# Patient Record
Sex: Male | Born: 1960
Health system: Southern US, Community
[De-identification: ages and names within clinical notes are randomized; demographics above are authoritative.]

## PROBLEM LIST (undated history)

## (undated) DIAGNOSIS — I480 Paroxysmal atrial fibrillation: Secondary | ICD-10-CM

## (undated) DIAGNOSIS — E7800 Pure hypercholesterolemia, unspecified: Secondary | ICD-10-CM

## (undated) DIAGNOSIS — I452 Bifascicular block: Secondary | ICD-10-CM

## (undated) HISTORY — PX: EYE SURGERY: SHX253

---
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
## 2008-12-17 LAB — HM COLONOSCOPY: HM Colonoscopy: NEGATIVE

## 2011-02-12 ENCOUNTER — Emergency Department (HOSPITAL_COMMUNITY)
Admission: EM | Admit: 2011-02-12 | Discharge: 2011-02-13 | Disposition: A | Discharge status: Home / Self Care | Payer: 59 | Attending: Emergency Medicine | Admitting: Emergency Medicine

## 2011-02-12 ENCOUNTER — Emergency Department (HOSPITAL_COMMUNITY): Payer: 59

## 2011-02-12 DIAGNOSIS — I4891 Unspecified atrial fibrillation: Secondary | ICD-10-CM | POA: Insufficient documentation

## 2011-02-12 DIAGNOSIS — R0789 Other chest pain: Secondary | ICD-10-CM | POA: Insufficient documentation

## 2011-02-12 DIAGNOSIS — I498 Other specified cardiac arrhythmias: Secondary | ICD-10-CM | POA: Insufficient documentation

## 2011-02-12 DIAGNOSIS — R002 Palpitations: Secondary | ICD-10-CM | POA: Insufficient documentation

## 2011-02-12 DIAGNOSIS — E785 Hyperlipidemia, unspecified: Secondary | ICD-10-CM | POA: Insufficient documentation

## 2011-02-12 DIAGNOSIS — I440 Atrioventricular block, first degree: Secondary | ICD-10-CM | POA: Insufficient documentation

## 2011-02-12 LAB — CBC
HCT: 42.5 % (ref 39.0–52.0)
Hemoglobin: 14.9 g/dL (ref 13.0–17.0)
MCH: 32.3 pg (ref 26.0–34.0)
MCV: 92.2 fL (ref 78.0–100.0)
Platelets: 202 10*3/uL (ref 150–400)
RBC: 4.61 MIL/uL (ref 4.22–5.81)
WBC: 4.7 10*3/uL (ref 4.0–10.5)

## 2011-02-12 LAB — POCT I-STAT, CHEM 8
BUN: 18 mg/dL (ref 6–23)
Calcium, Ion: 1.1 mmol/L — ABNORMAL LOW (ref 1.12–1.32)
Creatinine, Ser: 1.1 mg/dL (ref 0.4–1.5)
Hemoglobin: 15 g/dL (ref 13.0–17.0)
Sodium: 142 mEq/L (ref 135–145)
TCO2: 25 mmol/L (ref 0–100)

## 2011-02-12 LAB — POCT CARDIAC MARKERS: Myoglobin, poc: 57.4 ng/mL (ref 12–200)

## 2011-02-13 LAB — POCT CARDIAC MARKERS
CKMB, poc: <1 ng/mL — ABNORMAL LOW (ref 1.0–8.0)
Troponin i, poc: <0.05 ng/mL (ref 0.00–0.09)

## 2011-02-13 LAB — TSH: TSH: 3.282 u[IU]/mL (ref 0.350–4.500)

## 2011-06-19 ENCOUNTER — Inpatient Hospital Stay (INDEPENDENT_AMBULATORY_CARE_PROVIDER_SITE_OTHER)
Admission: RE | Admit: 2011-06-19 | Discharge: 2011-06-19 | Disposition: A | Discharge status: Home / Self Care | Payer: 59 | Source: Ambulatory Visit | Attending: Family Medicine | Admitting: Family Medicine

## 2011-06-19 ENCOUNTER — Other Ambulatory Visit: Payer: Self-pay | Admitting: Family Medicine

## 2011-06-19 ENCOUNTER — Ambulatory Visit
Admission: RE | Admit: 2011-06-19 | Discharge: 2011-06-19 | Disposition: A | Discharge status: Home / Self Care | Payer: 59 | Source: Ambulatory Visit | Attending: Family Medicine | Admitting: Family Medicine

## 2011-06-19 ENCOUNTER — Encounter: Payer: Self-pay | Admitting: Family Medicine

## 2011-06-19 DIAGNOSIS — S8000XA Contusion of unspecified knee, initial encounter: Secondary | ICD-10-CM

## 2011-06-19 DIAGNOSIS — S8010XA Contusion of unspecified lower leg, initial encounter: Secondary | ICD-10-CM

## 2011-06-19 DIAGNOSIS — E785 Hyperlipidemia, unspecified: Secondary | ICD-10-CM | POA: Insufficient documentation

## 2011-06-19 DIAGNOSIS — E7800 Pure hypercholesterolemia, unspecified: Secondary | ICD-10-CM | POA: Insufficient documentation

## 2011-09-29 NOTE — Progress Notes (Signed)
Summary: RT KNEE PAIN AND SWELLING...WSE rm 2   Vital Signs:  Patient Profile:   50 Years Old Male CC:      RT knee pain and swelling x 5 days Height:     71 inches Weight:      197.25 pounds O2 Sat:      98 % O2 treatment:    Room Air Temp:     98.8 degrees F oral Pulse rate:   52 / minute Resp:     16 per minute BP sitting:   136 / 88  (left arm) Cuff size:   regular  Pt. in pain?   yes    Location:   knee    Intensity:   5    Type:       dull  Vitals Entered By: Clemens Catholic LPN (June 19, 2011 3:50 PM)                   Prior Medication List:  No prior medications documented  Updated Prior Medication List: CRESTOR 10 MG TABS (ROSUVASTATIN CALCIUM)  FLUTICASONE PROPIONATE 50 MCG/ACT SUSP (FLUTICASONE PROPIONATE)  DILTIAZEM HCL 120 MG TABS (DILTIAZEM HCL)  OMEGA-3 CF 1000 MG CAPS (OMEGA-3 FATTY ACIDS)  DAILY MULTI  TABS (MULTIPLE VITAMINS-MINERALS)  ASPIRIN 81 MG CHEW (ASPIRIN)   Current Allergies: No known allergies History of Present Illness Chief Complaint: RT knee pain and swelling x 5 days History of Present Illness: Patient hit his medial aspect of his R knee. There was some swellling over the area which he was not too concerned about and worked out and rode his bike. He noticed last night and this AM swelling along the medial aspect of his R lower leg.  Current Problems: CONTUSION OF LOWER LEG (ICD-924.10) CONTUSION OF KNEE (ICD-924.11) HYPERLIPIDEMIA (ICD-272.4)   Current Meds CRESTOR 10 MG TABS (ROSUVASTATIN CALCIUM)  FLUTICASONE PROPIONATE 50 MCG/ACT SUSP (FLUTICASONE PROPIONATE)  DILTIAZEM HCL 120 MG TABS (DILTIAZEM HCL)  OMEGA-3 CF 1000 MG CAPS (OMEGA-3 FATTY ACIDS)  DAILY MULTI  TABS (MULTIPLE VITAMINS-MINERALS)  ASPIRIN 81 MG CHEW (ASPIRIN)   REVIEW OF SYSTEMS Constitutional Symptoms      Denies fever, chills, night sweats, weight loss, weight gain, and fatigue.  Eyes       Denies change in vision, eye pain, eye discharge, glasses,  contact lenses, and eye surgery. Ear/Nose/Throat/Mouth       Denies hearing loss/aids, change in hearing, ear pain, ear discharge, dizziness, frequent runny nose, frequent nose bleeds, sinus problems, sore throat, hoarseness, and tooth pain or bleeding.  Respiratory       Denies dry cough, productive cough, wheezing, shortness of breath, asthma, bronchitis, and emphysema/COPD.  Cardiovascular       Denies murmurs, chest pain, and tires easily with exhertion.    Gastrointestinal       Denies stomach pain, nausea/vomiting, diarrhea, constipation, blood in bowel movements, and indigestion. Genitourniary       Denies painful urination, kidney stones, and loss of urinary control. Neurological       Denies paralysis, seizures, and fainting/blackouts. Musculoskeletal       Complains of redness and swelling.      Denies muscle pain, joint pain, joint stiffness, decreased range of motion, muscle weakness, and gout.  Skin       Complains of bruising.      Denies unusual mles/lumps or sores and hair/skin or nail changes.  Psych       Denies mood changes, temper/anger issues, anxiety/stress, speech  problems, depression, and sleep problems. Other Comments: pt states that he hit his RT knee on a kitchen cabinet 5 days ago, some pain and swelling in the area that day. Yesterday he noticed more swelling to his knee and down his leg. he has taken IBF for the pain.   Past History:  Family History: Last updated: 06/19/2011 none  Social History: Last updated: 06/19/2011 Never Smoked Alcohol use-yes 3-4 drinks per wk Drug use-no  Risk Factors: Smoking Status: never (06/19/2011)  Past Medical History: Hyperlipidemia-borderline short run afib - controlled  Past Surgical History: Denies surgical history  Family History: Reviewed history and no changes required. none  Social History: Reviewed history and no changes required. Never Smoked Alcohol use-yes 3-4 drinks per wk Drug  use-no Smoking Status:  never Drug Use:  no Physical Exam General appearance: well developed, well nourished, no acute distress Extremities: Hematoma over the R medial area of his knee swelling along the whole lower R leg w/discoleration along the leg to his ankle Skin: no obvious rashes or lesions MSE: oriented to time, place, and person Assessment New Problems: CONTUSION OF LOWER LEG (ICD-924.10) CONTUSION OF KNEE (ICD-924.11) HYPERLIPIDEMIA (ICD-272.4)   Patient Education: would hold off any PE until 06/28/2011  Plan New Orders: T-DG Knee 2 Views*R* [73560] T-DG Tibia/Fibula*R* [16109] New Patient Level III [60454] Follow Up: Follow up on an as needed basis, Follow up with Primary Physician  The patient and/or caregiver has been counseled thoroughly with regard to medications prescribed including dosage, schedule, interactions, rationale for use, and possible side effects and they verbalize understanding.  Diagnoses and expected course of recovery discussed and will return if not improved as expected or if the condition worsens. Patient and/or caregiver verbalized understanding.   Patient Instructions: 1)  Please schedule a follow-up appointment as needed. 2)  Please schedule an appointment with your primary doctor in :5-7 days if not better 3)  ice the area 20 minutes out of 2 hours as tolerated 4)  no PE until after 06/28/2011 5)  use the knee brace  Orders Added: 1)  T-DG Knee 2 Views*R* [73560] 2)  T-DG Tibia/Fibula*R* [73590] 3)  New Patient Level III [09811]

## 2012-05-18 ENCOUNTER — Ambulatory Visit (HOSPITAL_COMMUNITY)
Admission: RE | Admit: 2012-05-18 | Discharge: 2012-05-18 | Disposition: A | Discharge status: Home / Self Care | Payer: 59 | Source: Ambulatory Visit | Attending: Cardiology | Admitting: Cardiology

## 2012-05-18 DIAGNOSIS — I4891 Unspecified atrial fibrillation: Secondary | ICD-10-CM | POA: Insufficient documentation

## 2012-05-18 DIAGNOSIS — I379 Nonrheumatic pulmonary valve disorder, unspecified: Secondary | ICD-10-CM | POA: Insufficient documentation

## 2012-05-18 DIAGNOSIS — I079 Rheumatic tricuspid valve disease, unspecified: Secondary | ICD-10-CM | POA: Insufficient documentation

## 2012-05-18 DIAGNOSIS — I359 Nonrheumatic aortic valve disorder, unspecified: Secondary | ICD-10-CM | POA: Insufficient documentation

## 2012-06-08 ENCOUNTER — Encounter: Payer: Self-pay | Admitting: *Deleted

## 2012-06-08 ENCOUNTER — Emergency Department
Admission: EM | Admit: 2012-06-08 | Discharge: 2012-06-08 | Disposition: A | Discharge status: Home / Self Care | Payer: 59 | Source: Home / Self Care | Attending: Family Medicine | Admitting: Family Medicine

## 2012-06-08 DIAGNOSIS — L309 Dermatitis, unspecified: Secondary | ICD-10-CM

## 2012-06-08 DIAGNOSIS — L259 Unspecified contact dermatitis, unspecified cause: Secondary | ICD-10-CM

## 2012-06-08 MED ORDER — CEPHALEXIN 500 MG PO CAPS: 500.0000 mg | ORAL_CAPSULE | Freq: Three times a day (TID) | ORAL | Status: AC

## 2012-06-08 MED ORDER — PREDNISONE 20 MG PO TABS: 20.0000 mg | ORAL_TABLET | Freq: Two times a day (BID) | ORAL | Status: AC

## 2012-06-08 NOTE — ED Notes (Signed)
Pt c/o rash on his RT upper leg x last night post insect bites. Denies fever. He has taken an antihistamine last night and applied hydrocortisone cream today with no relief.

## 2012-06-08 NOTE — ED Provider Notes (Signed)
History     CSN: [id number]  Arrival date & time 06/08/12  1851   First MD Initiated Contact with Patient 06/08/12 1944      Chief Complaint  Patient presents with  . Rash     HPI Comments: Pt c/o rash on his RT upper leg after working in his yard.  He believes that he may have insect bites but does not recall any actual bites.  Denies fever. He has taken an antihistamine last night and applied hydrocortisone cream today with no relief.  Today he has noticed new lesions on lateral aspect of right thigh.  He feels well otherwise.  Patient is a 51 y.o. male presenting with rash. The history is provided by the patient.  Rash  This is a new problem. The current episode started yesterday. The problem has been rapidly worsening. The problem is associated with an insect bite/sting. There has been no fever. The rash is present on the right upper leg. The patient is experiencing no pain. Associated symptoms include itching. Pertinent negatives include no blisters, no pain and no weeping. He has tried steriods for the symptoms. The treatment provided no relief.    Past Medical History  Diagnosis Date  . Afib     Past Surgical History  Procedure Date  . Hernia repair     Family History  Problem Relation Age of Onset  . COPD Mother   . COPD Father     History  Substance Use Topics  . Smoking status: Never Smoker   . Smokeless tobacco: Not on file  . Alcohol Use: Yes      Review of Systems  Skin: Positive for itching and rash.  All other systems reviewed and are negative.    Allergies  Review of patient's allergies indicates no known allergies.  Home Medications   Current Outpatient Rx  Name Route Sig Dispense Refill  . ASPIRIN 81 MG PO TABS Oral Take 81 mg by mouth daily.    Marland Kitchen DILTIAZEM HCL 120 MG PO TABS Oral Take 120 mg by mouth 4 (four) times daily.    . OMEGA-3 FATTY ACIDS 1000 MG PO CAPS Oral Take 2 g by mouth daily.    Marland Kitchen FLUTICASONE PROPIONATE 50 MCG/ACT NA  SUSP Nasal Place 2 sprays into the nose daily.    . MULTIPLE VITAMIN PO Oral Take by mouth.    . ROSUVASTATIN CALCIUM 10 MG PO TABS Oral Take 10 mg by mouth daily.    . CEPHALEXIN 500 MG PO CAPS Oral Take 1 capsule (500 mg total) by mouth 3 (three) times daily. 21 capsule 0  . PREDNISONE 20 MG PO TABS Oral Take 1 tablet (20 mg total) by mouth 2 (two) times daily. Take with food. 10 tablet 0    BP 135/87  Pulse 60  Temp 98.2 F (36.8 C) (Oral)  Resp 16  Ht 5\' 11"  (1.803 m)  Wt 195 lb (88.451 kg)  BMI 27.20 kg/m2  SpO2 99%  Physical Exam  Nursing note and vitals reviewed. Constitutional: He is oriented to person, place, and time. He appears well-developed and well-nourished. No distress.  HENT:  Mouth/Throat: Oropharynx is clear and moist.  Eyes: Conjunctivae are normal. Pupils are equal, round, and reactive to light.  Cardiovascular: Normal heart sounds.   Pulmonary/Chest: Breath sounds normal.  Musculoskeletal: He exhibits no edema.  Neurological: He is alert and oriented to person, place, and time.  Skin: Skin is warm and dry. Rash noted. Rash is macular.  On the right anterior thigh as noted on diagram there is a large area of confluent erythema.  On the areas marked in green there are crops of discrete small maculo-papular lesions suggestive of zoster.  No tenderness or fluctuance.     ED Course  Procedures none      1. Dermatitis;  Suspect insect bites with secondary cellulitis.  However, could also be herpes zoster, although association with outdoor exposure more likely to be a result of insect bite toxin      MDM  Begin empiric Keflex.  Prednisone burst.  Continue Claritin Followup with Family Doctor if not improved in about 3 days.        Lattie Haw, MD 06/10/12 [phone]

## 2012-06-12 ENCOUNTER — Telehealth: Payer: Self-pay

## 2012-06-12 NOTE — ED Notes (Signed)
I called and spoke with patient and he is doing better. I advised to call back if anything changes or if he has questions or concerns.  

## 2012-12-17 ENCOUNTER — Encounter: Payer: Self-pay | Admitting: Family Medicine

## 2012-12-17 ENCOUNTER — Ambulatory Visit (INDEPENDENT_AMBULATORY_CARE_PROVIDER_SITE_OTHER): Payer: 59 | Admitting: Family Medicine

## 2012-12-17 VITALS — BP 120/80 | HR 72 | Temp 97.4°F | Resp 12 | Ht 71.0 in | Wt 194.0 lb

## 2012-12-17 DIAGNOSIS — Z0000 Encounter for general adult medical examination without abnormal findings: Secondary | ICD-10-CM

## 2012-12-17 DIAGNOSIS — I4891 Unspecified atrial fibrillation: Secondary | ICD-10-CM

## 2012-12-17 DIAGNOSIS — E785 Hyperlipidemia, unspecified: Secondary | ICD-10-CM

## 2012-12-17 DIAGNOSIS — Z23 Encounter for immunization: Secondary | ICD-10-CM

## 2012-12-17 LAB — BASIC METABOLIC PANEL
Calcium: 9.5 mg/dL (ref 8.4–10.5)
GFR: 96.12 mL/min (ref >60.00)
Glucose, Bld: 91 mg/dL (ref 70–99)
Potassium: 4 mEq/L (ref 3.5–5.1)
Sodium: 139 mEq/L (ref 135–145)

## 2012-12-17 LAB — CBC WITH DIFFERENTIAL/PLATELET
Basophils Relative: 0.6 % (ref 0.0–3.0)
Eosinophils Absolute: 0.1 10*3/uL (ref 0.0–0.7)
Eosinophils Relative: 1.1 % (ref 0.0–5.0)
HCT: 45.7 % (ref 39.0–52.0)
Lymphs Abs: 1.6 10*3/uL (ref 0.7–4.0)
MCHC: 33.8 g/dL (ref 30.0–36.0)
MCV: 93.5 fl (ref 78.0–100.0)
Monocytes Absolute: 0.5 10*3/uL (ref 0.1–1.0)
Platelets: 309 10*3/uL (ref 150.0–400.0)
WBC: 6.3 10*3/uL (ref 4.5–10.5)

## 2012-12-17 LAB — LIPID PANEL
Cholesterol: 194 mg/dL (ref 0–200)
HDL: 50.8 mg/dL (ref >39.00)
VLDL: 26.6 mg/dL (ref 0.0–40.0)

## 2012-12-17 LAB — HEPATIC FUNCTION PANEL
ALT: 80 U/L — ABNORMAL HIGH (ref 0–53)
AST: 41 U/L — ABNORMAL HIGH (ref 0–37)
Albumin: 4.6 g/dL (ref 3.5–5.2)

## 2012-12-17 LAB — PSA: PSA: 1.2 ng/mL (ref 0.10–4.00)

## 2012-12-17 LAB — TSH: TSH: 1.59 u[IU]/mL (ref 0.35–5.50)

## 2012-12-17 MED ORDER — FLUTICASONE PROPIONATE 50 MCG/ACT NA SUSP: 2.0000 | Freq: Every day | NASAL | Status: DC

## 2012-12-17 NOTE — Progress Notes (Signed)
  Subjective:    Patient ID: Tony Silva, male    DOB: Mar 30, 1961, 52 y.o.   MRN: [id number]  HPI Patient new to establish care. Past medical history reviewed. He has history of lone atrial fibrillation. Followed by cardiology. Generally has about 3-4 episodes per year. He takes diltiazem. Also Crestor for hyperlipidemia. Previous intolerance to Lipitor. Flonase for perennial allergies.  Last tetanus unknown. Exercises regularly. No history of smoking. Only occasional alcohol use.  Family history significant for both parents with COPD and they were both smokers. Otherwise unrevealing.   Review of Systems  Constitutional: Negative for fever, activity change, appetite change and fatigue.  HENT: Negative for ear pain, congestion and trouble swallowing.   Eyes: Negative for pain and visual disturbance.  Respiratory: Negative for cough, shortness of breath and wheezing.   Cardiovascular: Negative for chest pain and palpitations.  Gastrointestinal: Negative for nausea, vomiting, abdominal pain, diarrhea, constipation, blood in stool, abdominal distention and rectal pain.  Genitourinary: Negative for dysuria, hematuria and testicular pain.  Musculoskeletal: Negative for joint swelling and arthralgias.  Skin: Negative for rash.  Neurological: Negative for dizziness, syncope and headaches.  Hematological: Negative for adenopathy.  Psychiatric/Behavioral: Negative for confusion and dysphoric mood.       Objective:   Physical Exam  Constitutional: He is oriented to person, place, and time. He appears well-developed and well-nourished. No distress.  HENT:  Head: Normocephalic and atraumatic.  Right Ear: External ear normal.  Left Ear: External ear normal.  Mouth/Throat: Oropharynx is clear and moist.  Eyes: Conjunctivae and EOM are normal. Pupils are equal, round, and reactive to light.  Neck: Normal range of motion. Neck supple. No thyromegaly present.  Cardiovascular: Normal rate,  regular rhythm and normal heart sounds.   No murmur heard. Pulmonary/Chest: No respiratory distress. He has no wheezes. He has no rales.  Abdominal: Soft. Bowel sounds are normal. He exhibits no distension and no mass. There is no tenderness. There is no rebound and no guarding.  Genitourinary: Rectum normal and prostate normal.  Musculoskeletal: He exhibits no edema.  Lymphadenopathy:    He has no cervical adenopathy.  Neurological: He is alert and oriented to person, place, and time. He displays normal reflexes. No cranial nerve deficit.  Skin: No rash noted.  Psychiatric: He has a normal mood and affect.          Assessment & Plan:  Complete physical. Tetanus booster given. Obtain screening lab work. Continue regular exercise habits. Refill Flonase for one year. He will need repeat colonoscopy in 2 years (history of reported hyperplastic polyps by colonoscopy 3 years ago).

## 2012-12-21 ENCOUNTER — Other Ambulatory Visit: Payer: Self-pay | Admitting: *Deleted

## 2012-12-21 DIAGNOSIS — R7989 Other specified abnormal findings of blood chemistry: Secondary | ICD-10-CM

## 2012-12-21 NOTE — Progress Notes (Signed)
Quick Note:  Sent lab result note to My Chart ______

## 2013-01-02 ENCOUNTER — Encounter: Payer: Self-pay | Admitting: Family Medicine

## 2013-03-07 ENCOUNTER — Encounter: Payer: Self-pay | Admitting: Family Medicine

## 2013-03-08 NOTE — Telephone Encounter (Signed)
Pt requesting Scopase 0.4 mg for motion sickness prevention.  I'm not sure how locally available this is. OK to prescribe and dosing is one every 8 hours prn disp #20.  Please let pt know we are sending this in.

## 2013-03-09 NOTE — Telephone Encounter (Signed)
I could not find Scopase in Epic, so I called [facility].  Pharmacist checked into it and could not find it to order, so she googled it and med was discontinued several years ago.  I told our patient and he is going to research it a little more.  He will my chart [place] back if he needs the patch.  FYI

## 2013-03-10 NOTE — Telephone Encounter (Signed)
noted 

## 2013-03-14 NOTE — Telephone Encounter (Signed)
Send in scopolamine patches as requested.  I accidentally closed this note prematurely so adding addendum

## 2013-03-14 NOTE — Telephone Encounter (Signed)
Pt requesting the patch for traveling, scopace discontinued

## 2013-03-14 NOTE — Telephone Encounter (Signed)
OK to send in Scopolamine patches one behind ear q 72 hours prn motion sickness prevention.  Disp 9 patches.

## 2013-03-15 MED ORDER — SCOPOLAMINE 1 MG/3DAYS TD PT72: 1.0000 | MEDICATED_PATCH | TRANSDERMAL | Status: DC

## 2013-03-15 NOTE — Addendum Note (Signed)
Addended by: Melchor Amour on: 03/15/2013 08:09 AM   Modules accepted: Orders

## 2013-03-16 ENCOUNTER — Other Ambulatory Visit (INDEPENDENT_AMBULATORY_CARE_PROVIDER_SITE_OTHER): Payer: 59

## 2013-03-16 ENCOUNTER — Encounter: Payer: Self-pay | Admitting: Family Medicine

## 2013-03-16 DIAGNOSIS — R7989 Other specified abnormal findings of blood chemistry: Secondary | ICD-10-CM

## 2013-03-16 LAB — HEPATIC FUNCTION PANEL
Albumin: 4.3 g/dL (ref 3.5–5.2)
Total Bilirubin: 0.8 mg/dL (ref 0.3–1.2)

## 2013-05-03 ENCOUNTER — Ambulatory Visit (HOSPITAL_COMMUNITY)
Admission: RE | Admit: 2013-05-03 | Discharge: 2013-05-03 | Disposition: A | Discharge status: Home / Self Care | Payer: 59 | Source: Ambulatory Visit | Attending: Cardiology | Admitting: Cardiology

## 2013-05-03 DIAGNOSIS — I4891 Unspecified atrial fibrillation: Secondary | ICD-10-CM | POA: Insufficient documentation

## 2013-05-03 NOTE — Progress Notes (Signed)
*  PRELIMINARY RESULTS* Echocardiogram 2D Echocardiogram has been performed.  Tony Silva 05/03/2013, 11:54 AM

## 2013-06-16 ENCOUNTER — Other Ambulatory Visit: Payer: Self-pay | Admitting: Cardiology

## 2013-06-16 DIAGNOSIS — R079 Chest pain, unspecified: Secondary | ICD-10-CM

## 2013-06-17 ENCOUNTER — Other Ambulatory Visit: Payer: Self-pay | Admitting: Cardiovascular Disease

## 2013-06-17 DIAGNOSIS — R079 Chest pain, unspecified: Secondary | ICD-10-CM

## 2013-06-21 ENCOUNTER — Ambulatory Visit (INDEPENDENT_AMBULATORY_CARE_PROVIDER_SITE_OTHER)
Admission: RE | Admit: 2013-06-21 | Discharge: 2013-06-21 | Disposition: A | Discharge status: Home / Self Care | Payer: 59 | Source: Ambulatory Visit | Attending: Cardiovascular Disease | Admitting: Cardiovascular Disease

## 2013-06-21 ENCOUNTER — Ambulatory Visit (HOSPITAL_COMMUNITY): Payer: 59

## 2013-06-21 DIAGNOSIS — R079 Chest pain, unspecified: Secondary | ICD-10-CM

## 2013-06-22 ENCOUNTER — Encounter: Payer: Self-pay | Admitting: Family Medicine

## 2013-09-01 ENCOUNTER — Other Ambulatory Visit: Payer: Self-pay

## 2013-10-10 ENCOUNTER — Ambulatory Visit: Payer: 59 | Admitting: Family Medicine

## 2013-10-12 ENCOUNTER — Encounter: Payer: Self-pay | Admitting: Family Medicine

## 2013-10-12 ENCOUNTER — Ambulatory Visit (INDEPENDENT_AMBULATORY_CARE_PROVIDER_SITE_OTHER): Payer: 59 | Admitting: Family Medicine

## 2013-10-12 VITALS — BP 130/90 | HR 93 | Temp 98.6°F | Wt 201.0 lb

## 2013-10-12 DIAGNOSIS — R1031 Right lower quadrant pain: Secondary | ICD-10-CM

## 2013-10-12 NOTE — Progress Notes (Signed)
Pre visit review using our clinic review tool, if applicable. No additional management support is needed unless otherwise documented below in the visit note. 

## 2013-10-12 NOTE — Patient Instructions (Signed)
Be in touch if you note any increased pain.

## 2013-10-12 NOTE — Progress Notes (Signed)
   Subjective:    Patient ID: Tony Silva, male    DOB: [birth year], 52 y.o.   MRN: [id number]  HPI Right inguinal pain. Noted a few months ago. Worse with standing. Mild swelling. Patient relates history of left inguinal hernia at age 84 or 1. 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 This does come down fully into the sac with things like showers Pain is relatively mild. Only noted with standing.  Past Medical History  Diagnosis Date  . Afib   . Hypertension    Past Surgical History  Procedure Laterality Date  . Hernia repair  1979    reports that he has never smoked. He does not have any smokeless tobacco history on file. He reports that he drinks alcohol. He reports that he does not use illicit drugs. family history includes COPD in his father and mother. No Known Allergies     Review of Systems  Constitutional: Negative for appetite change and unexpected weight change.  Genitourinary: Negative for dysuria.  Hematological: Negative for adenopathy.       Objective:   Physical Exam  Constitutional: He appears well-developed and well-nourished.  Cardiovascular: Normal rate.   Genitourinary:  Retracted right testicle. Left testicle is normal. We could not palpate any definite hernia mass this time          Assessment & Plan:  Right inguinal pain. Retracted right testicle. No definite hernia. Could be early right inguinal hernia but difficult to ascertain. Followup with general surgeon if symptoms persist or worsen

## 2013-11-17 ENCOUNTER — Other Ambulatory Visit: Payer: Self-pay | Admitting: Cardiology

## 2013-11-20 ENCOUNTER — Telehealth: Payer: Self-pay | Admitting: Family Medicine

## 2013-11-20 NOTE — Telephone Encounter (Signed)
[facility] requesting refill of rosuvastatin (CRESTOR) 10 MG tablet

## 2013-11-21 MED ORDER — ROSUVASTATIN CALCIUM 10 MG PO TABS: 10.0000 mg | ORAL_TABLET | ORAL | Status: DC

## 2013-11-21 NOTE — Telephone Encounter (Signed)
RX sent to pharmacy  

## 2014-02-01 ENCOUNTER — Other Ambulatory Visit: Payer: Self-pay | Admitting: Family Medicine

## 2014-04-17 ENCOUNTER — Encounter: Payer: Self-pay | Admitting: Family Medicine

## 2014-04-18 ENCOUNTER — Other Ambulatory Visit: Payer: Self-pay | Admitting: Family Medicine

## 2014-04-18 DIAGNOSIS — K4090 Unilateral inguinal hernia, without obstruction or gangrene, not specified as recurrent: Secondary | ICD-10-CM

## 2014-04-19 MED ORDER — ZOSTER VACCINE LIVE 19400 UNT/0.65ML ~~LOC~~ SOLR: 0.6500 mL | Freq: Once | SUBCUTANEOUS | Status: DC

## 2014-05-05 ENCOUNTER — Encounter (INDEPENDENT_AMBULATORY_CARE_PROVIDER_SITE_OTHER): Payer: Self-pay | Admitting: General Surgery

## 2014-05-05 ENCOUNTER — Ambulatory Visit (INDEPENDENT_AMBULATORY_CARE_PROVIDER_SITE_OTHER): Payer: Commercial Managed Care - PPO | Admitting: General Surgery

## 2014-05-05 VITALS — BP 126/80 | HR 85 | Temp 97.5°F | Resp 16 | Ht 72.0 in | Wt 196.0 lb

## 2014-05-05 DIAGNOSIS — K4090 Unilateral inguinal hernia, without obstruction or gangrene, not specified as recurrent: Secondary | ICD-10-CM

## 2014-05-05 NOTE — Progress Notes (Signed)
Patient ID: Tony Silva, male   DOB: 01/30/1961, 53 y.o.   MRN: [id number]  Chief Complaint  Patient presents with  . eval hernia    HPI Tony Silva is a 53 y.o. male.  The patient is a 53 year old male who is referred by Dr. Jarold Song for evaluation of a right inguinal hernia. The patient states she's had increasing pain over the last several years. He does have a history of a left femoral hernia that was fixed at the age of 22 and 60. The patient has a easily retractable practice will however them to scrotum stone.  Patient has a history of A. Fib for which he takes diltiazem H. Asher bouts of A. Fib every several months. He is not on any blood thinners.  HPI  Past Medical History  Diagnosis Date  . Afib   . Hypertension     Past Surgical History  Procedure Laterality Date  . Hernia repair  1979    Family History  Problem Relation Age of Onset  . COPD Mother   . COPD Father     Social History History  Substance Use Topics  . Smoking status: Never Smoker   . Smokeless tobacco: Not on file  . Alcohol Use: Yes    No Known Allergies  Current Outpatient Prescriptions  Medication Sig Dispense Refill  . diltiazem (CARDIZEM) 120 MG tablet Take 120 mg by mouth daily.       . fish oil-omega-3 fatty acids 1000 MG capsule Take 2 g by mouth daily.      . fluticasone (FLONASE) 50 MCG/ACT nasal spray PLACE 2 SPRAYS INTO THE NOSE DAILY.  48 g  3  . MULTIPLE VITAMIN PO Take by mouth.      . rosuvastatin (CRESTOR) 10 MG tablet Take 1 tablet (10 mg total) by mouth once a week.  30 tablet  1  . scopolamine (TRANSDERM-SCOP) 1.5 MG Place 1 patch (1.5 mg total) onto the skin every 3 (three) days.  10 patch  0  . zoster vaccine live, PF, (ZOSTAVAX) 78295 UNT/0.65ML injection Inject 19,400 Units into the skin once.  1 each  0   No current facility-administered medications for this visit.    Review of Systems Review of Systems  Constitutional: Negative.   HENT: Negative.   Eyes:  Negative.   Respiratory: Negative.   Cardiovascular: Negative.   Gastrointestinal: Negative.   Endocrine: Negative.   Neurological: Negative.     Blood pressure 126/80, pulse 85, temperature 97.5 F (36.4 C), resp. rate 16, height 6' (1.829 m), weight 196 lb (88.905 kg).  Physical Exam Physical Exam  Constitutional: He is oriented to person, place, and time. He appears well-developed and well-nourished.  HENT:  Head: Normocephalic and atraumatic.  Eyes: Conjunctivae and EOM are normal. Pupils are equal, round, and reactive to light.  Neck: Normal range of motion. Neck supple.  Cardiovascular: Normal rate, regular rhythm and normal heart sounds.   Pulmonary/Chest: Effort normal and breath sounds normal.  Abdominal: A hernia is present. Hernia confirmed positive in the right inguinal area. Hernia confirmed negative in the left inguinal area.    Healed LIHR incision  Musculoskeletal: Normal range of motion.  Neurological: He is alert and oriented to person, place, and time.  Skin: Skin is warm and dry.    Data Reviewed none  Assessment    53 year old male with a right inguinal hernia     Plan    1. We'll proceed to the operating room for an  open right inguinal hernia repair with mesh 2.All risks and benefits were discussed with the patient, to generally include infection, bleeding, damage to surrounding structures, acute and chronic nerve pain, and recurrence. Alternatives were offered and described.  All questions were answered and the patient voiced understanding of the procedure and wishes to proceed at this point.         Rosario Jacks., Veta Dambrosia 05/05/2014, 9:25 AM

## 2014-05-17 ENCOUNTER — Other Ambulatory Visit (INDEPENDENT_AMBULATORY_CARE_PROVIDER_SITE_OTHER): Payer: Self-pay | Admitting: General Surgery

## 2014-05-23 ENCOUNTER — Encounter (HOSPITAL_COMMUNITY): Payer: Self-pay | Admitting: Pharmacy Technician

## 2014-05-29 ENCOUNTER — Encounter (HOSPITAL_COMMUNITY): Payer: Self-pay

## 2014-05-29 ENCOUNTER — Encounter (HOSPITAL_COMMUNITY)
Admission: RE | Admit: 2014-05-29 | Discharge: 2014-05-29 | Disposition: A | Discharge status: Home / Self Care | Payer: 59 | Source: Ambulatory Visit | Attending: Anesthesiology | Admitting: Anesthesiology

## 2014-05-29 ENCOUNTER — Encounter (HOSPITAL_COMMUNITY)
Admission: RE | Admit: 2014-05-29 | Discharge: 2014-05-29 | Disposition: A | Discharge status: Home / Self Care | Payer: 59 | Source: Ambulatory Visit | Attending: General Surgery | Admitting: General Surgery

## 2014-05-29 DIAGNOSIS — Z01812 Encounter for preprocedural laboratory examination: Secondary | ICD-10-CM | POA: Insufficient documentation

## 2014-05-29 DIAGNOSIS — Z01818 Encounter for other preprocedural examination: Secondary | ICD-10-CM | POA: Insufficient documentation

## 2014-05-29 DIAGNOSIS — E7800 Pure hypercholesterolemia, unspecified: Secondary | ICD-10-CM | POA: Insufficient documentation

## 2014-05-29 DIAGNOSIS — Z01810 Encounter for preprocedural cardiovascular examination: Secondary | ICD-10-CM | POA: Insufficient documentation

## 2014-05-29 DIAGNOSIS — I4891 Unspecified atrial fibrillation: Secondary | ICD-10-CM | POA: Insufficient documentation

## 2014-05-29 DIAGNOSIS — I10 Essential (primary) hypertension: Secondary | ICD-10-CM | POA: Insufficient documentation

## 2014-05-29 HISTORY — DX: Pure hypercholesterolemia, unspecified: E78.00

## 2014-05-29 LAB — COMPREHENSIVE METABOLIC PANEL
ALT: 33 U/L (ref 0–53)
AST: 26 U/L (ref 0–37)
Albumin: 4.4 g/dL (ref 3.5–5.2)
Alkaline Phosphatase: 59 U/L (ref 39–117)
Anion gap: 14 (ref 5–15)
BILIRUBIN TOTAL: 0.4 mg/dL (ref 0.3–1.2)
BUN: 13 mg/dL (ref 6–23)
CO2: 27 meq/L (ref 19–32)
CREATININE: 0.83 mg/dL (ref 0.50–1.35)
Calcium: 9.1 mg/dL (ref 8.4–10.5)
Chloride: 100 mEq/L (ref 96–112)
GFR calc Af Amer: >90 mL/min (ref >90)
Glucose, Bld: 151 mg/dL — ABNORMAL HIGH (ref 70–99)
POTASSIUM: 3.6 meq/L — AB (ref 3.7–5.3)
Sodium: 141 mEq/L (ref 137–147)
Total Protein: 7.2 g/dL (ref 6.0–8.3)

## 2014-05-29 LAB — CBC
HEMATOCRIT: 42.7 % (ref 39.0–52.0)
Hemoglobin: 14.7 g/dL (ref 13.0–17.0)
MCH: 32.4 pg (ref 26.0–34.0)
MCHC: 34.4 g/dL (ref 30.0–36.0)
MCV: 94.1 fL (ref 78.0–100.0)
PLATELETS: 222 10*3/uL (ref 150–400)
RBC: 4.54 MIL/uL (ref 4.22–5.81)
RDW: 12.4 % (ref 11.5–15.5)
WBC: 4.9 10*3/uL (ref 4.0–10.5)

## 2014-05-29 NOTE — Progress Notes (Addendum)
Patient states he had a-fib, was started on cardizem and has had no problems since.  Heart tests came back neg.  He sees Dr. Sheilah Pigeon acouple of months ago.   Have called [facility] x 2, leaving a message for medical records to send me his last LOV and EKG.   [phone]

## 2014-05-29 NOTE — Pre-Procedure Instructions (Signed)
HERMON ZEA  05/29/2014   Your procedure is scheduled on:  August 10th, Monday   Report to [facility] Admitting at  9:00 AM.   Call this number if you have problems the morning of surgery: [phone]   Remember:   Do not eat food or drink liquids after midnight Sunday.   Take these medicines the morning of surgery with A SIP OF WATER: Cardizem. Please use Flonase.   Do not wear jewelry - no rings or watches.  Do not wear lotions or colognes.   You may not wear deodorant.   Men may shave face and neck.   Do not bring valuables to the hospital.  [facility] is not responsible for any belongings or valuables.               Contacts, dentures or bridgework may not be worn into surgery.  Leave suitcase in the car. After surgery it may be brought to your room.  For patients admitted to the hospital, discharge time is determined by your treatment team.               Patients discharged the day of surgery will not be allowed to drive home, and will need someone to stay with you for 24 hrs afterwards.   Name and phone number of your driver:    Special Instructions: "Preparing for Surgery" instruction sheet.   Please read over the following fact sheets that you were given: Pain Booklet and Surgical Site Infection Prevention

## 2014-05-30 ENCOUNTER — Encounter (HOSPITAL_COMMUNITY): Payer: Self-pay

## 2014-05-30 NOTE — Progress Notes (Signed)
Anesthesia Chart Review:  Patient is a 53 year old male scheduled for open right IHR on 06/05/14 by Dr. Rosendo Gros.  History includes paroxysmal atrial fibrillation 01/2011, HTN, hypercholesterolemia, non-smoker, left femoral hernia repair '79, lasik eye surgery. PCP is Dr. Carolann Littler.  Cardiologist is Dr. Marlou Porch, last office note from 05/24/12.    Meds: MVI, diltiazem, fish oil, Flonase, Crestor, scopolamine patch PRN.  EKG on 05/29/14 showed: SR with first degree AVB.  EKG appears stable since 02/12/11.  Echo on 05/03/13 showed:  - Left ventricle: The cavity size was normal. There was mild focal basal hypertrophy of the septum. Systolic function was normal. The estimated ejection fraction was in the range of 60% to 65%. Wall motion was normal; there were no regional wall motion abnormalities. - Left atrium: The atrium was mildly dilated. - Aortic root: Mildly dilated, 38 mm.  - Atrial septum: There was redundancy of the septum, with borderline criteria for aneurysm. - Tricuspid valve: Trivial regurgitation.  CT cardiac calcium scoring on 06/21/13 showed:  1. No significant incidental noncardiac findings.  2. Old healed posterior left sided rib fractures, as above.  Cardiac Calcium Score:  Findings:  Calcium Score 0  Normal Pericardium  Ascending Aorta normal 3.1 cm  Impression: Calcium Score 0   CXR on 05/29/14 showed: No active cardiopulmonary disease.  Preoperative labs noted.   Patient has a history of PAF in 01/2011.  PAT EKG shows SR.  He remains on diltiazem.  No acute CV symptoms documented from his PAT visit.  If no acute changes then I would anticipate that he could proceed as planned.  Reviewed with anesthesiologist Dr. Linna Caprice.  [facility] Stay [facility]/Anesthesiology Phone [phone] 05/30/2014 4:45 PM

## 2014-06-04 MED ORDER — CEFAZOLIN SODIUM-DEXTROSE 2-3 GM-% IV SOLR
2.0000 g | INTRAVENOUS | Status: AC
  Administered 2014-06-05: 2 g via INTRAVENOUS
  Filled 2014-06-04: qty 50

## 2014-06-04 MED ORDER — CHLORHEXIDINE GLUCONATE 4 % EX LIQD
1.0000 "application " | Freq: Once | CUTANEOUS | Status: DC
  Filled 2014-06-04: qty 15

## 2014-06-05 ENCOUNTER — Ambulatory Visit (HOSPITAL_COMMUNITY)
Admission: RE | Admit: 2014-06-05 | Discharge: 2014-06-05 | Disposition: A | Discharge status: Home / Self Care | Payer: 59 | Source: Ambulatory Visit | Attending: General Surgery | Admitting: General Surgery

## 2014-06-05 ENCOUNTER — Encounter (HOSPITAL_COMMUNITY): Payer: Self-pay | Admitting: *Deleted

## 2014-06-05 ENCOUNTER — Ambulatory Visit (HOSPITAL_COMMUNITY): Payer: 59 | Admitting: Anesthesiology

## 2014-06-05 ENCOUNTER — Encounter (HOSPITAL_COMMUNITY)
Admission: RE | Disposition: A | Discharge status: Home / Self Care | Payer: Self-pay | Source: Ambulatory Visit | Attending: General Surgery

## 2014-06-05 ENCOUNTER — Encounter (HOSPITAL_COMMUNITY): Payer: 59 | Admitting: Vascular Surgery

## 2014-06-05 DIAGNOSIS — Z79899 Other long term (current) drug therapy: Secondary | ICD-10-CM | POA: Insufficient documentation

## 2014-06-05 DIAGNOSIS — I4891 Unspecified atrial fibrillation: Secondary | ICD-10-CM | POA: Insufficient documentation

## 2014-06-05 DIAGNOSIS — I10 Essential (primary) hypertension: Secondary | ICD-10-CM | POA: Insufficient documentation

## 2014-06-05 DIAGNOSIS — K4090 Unilateral inguinal hernia, without obstruction or gangrene, not specified as recurrent: Secondary | ICD-10-CM | POA: Insufficient documentation

## 2014-06-05 HISTORY — PX: INGUINAL HERNIA REPAIR: SHX194

## 2014-06-05 HISTORY — PX: INSERTION OF MESH: SHX5868

## 2014-06-05 SURGERY — REPAIR, HERNIA, INGUINAL, ADULT
Anesthesia: General | Site: Groin | Laterality: Right

## 2014-06-05 MED ORDER — HYDROMORPHONE HCL PF 1 MG/ML IJ SOLN: 0.2500 mg | INTRAMUSCULAR | Status: DC | PRN

## 2014-06-05 MED ORDER — FENTANYL CITRATE 0.05 MG/ML IJ SOLN
INTRAMUSCULAR | Status: AC
  Filled 2014-06-05: qty 5

## 2014-06-05 MED ORDER — OXYCODONE-ACETAMINOPHEN 7.5-325 MG PO TABS: 1.0000 | ORAL_TABLET | ORAL | Status: DC | PRN

## 2014-06-05 MED ORDER — ONDANSETRON HCL 4 MG/2ML IJ SOLN: 4.0000 mg | Freq: Once | INTRAMUSCULAR | Status: DC | PRN

## 2014-06-05 MED ORDER — ARTIFICIAL TEARS OP OINT
TOPICAL_OINTMENT | OPHTHALMIC | Status: DC | PRN
  Administered 2014-06-05: 1 via OPHTHALMIC

## 2014-06-05 MED ORDER — 0.9 % SODIUM CHLORIDE (POUR BTL) OPTIME
TOPICAL | Status: DC | PRN
  Administered 2014-06-05: 1000 mL

## 2014-06-05 MED ORDER — PHENYLEPHRINE 40 MCG/ML (10ML) SYRINGE FOR IV PUSH (FOR BLOOD PRESSURE SUPPORT)
PREFILLED_SYRINGE | INTRAVENOUS | Status: AC
  Filled 2014-06-05: qty 10

## 2014-06-05 MED ORDER — NEOSTIGMINE METHYLSULFATE 10 MG/10ML IV SOLN
INTRAVENOUS | Status: AC
  Filled 2014-06-05: qty 1

## 2014-06-05 MED ORDER — ONDANSETRON HCL 4 MG/2ML IJ SOLN
INTRAMUSCULAR | Status: DC | PRN
  Administered 2014-06-05: 4 mg via INTRAVENOUS

## 2014-06-05 MED ORDER — PROPOFOL 10 MG/ML IV BOLUS
INTRAVENOUS | Status: AC
  Filled 2014-06-05: qty 20

## 2014-06-05 MED ORDER — ONDANSETRON HCL 4 MG/2ML IJ SOLN
INTRAMUSCULAR | Status: AC
  Filled 2014-06-05: qty 2

## 2014-06-05 MED ORDER — GLYCOPYRROLATE 0.2 MG/ML IJ SOLN
INTRAMUSCULAR | Status: DC | PRN
  Administered 2014-06-05: .6 mg via INTRAVENOUS

## 2014-06-05 MED ORDER — MIDAZOLAM HCL 5 MG/5ML IJ SOLN
INTRAMUSCULAR | Status: DC | PRN
  Administered 2014-06-05: 2 mg via INTRAVENOUS

## 2014-06-05 MED ORDER — PROPOFOL 10 MG/ML IV BOLUS
INTRAVENOUS | Status: DC | PRN
  Administered 2014-06-05: 200 mg via INTRAVENOUS

## 2014-06-05 MED ORDER — LACTATED RINGERS IV SOLN
INTRAVENOUS | Status: DC
  Administered 2014-06-05: 09:00:00 via INTRAVENOUS

## 2014-06-05 MED ORDER — GLYCOPYRROLATE 0.2 MG/ML IJ SOLN
INTRAMUSCULAR | Status: AC
  Filled 2014-06-05: qty 3

## 2014-06-05 MED ORDER — NEOSTIGMINE METHYLSULFATE 10 MG/10ML IV SOLN
INTRAVENOUS | Status: DC | PRN
  Administered 2014-06-05: 3 mg via INTRAVENOUS

## 2014-06-05 MED ORDER — BUPIVACAINE-EPINEPHRINE (PF) 0.5% -1:200000 IJ SOLN
INTRAMUSCULAR | Status: DC | PRN
  Administered 2014-06-05: 30 mL via PERINEURAL

## 2014-06-05 MED ORDER — ROCURONIUM BROMIDE 50 MG/5ML IV SOLN
INTRAVENOUS | Status: AC
  Filled 2014-06-05: qty 1

## 2014-06-05 MED ORDER — EPHEDRINE SULFATE 50 MG/ML IJ SOLN
INTRAMUSCULAR | Status: AC
  Filled 2014-06-05: qty 1

## 2014-06-05 MED ORDER — EPHEDRINE SULFATE 50 MG/ML IJ SOLN
INTRAMUSCULAR | Status: DC | PRN
  Administered 2014-06-05 (×2): 10 mg via INTRAVENOUS

## 2014-06-05 MED ORDER — PHENYLEPHRINE HCL 10 MG/ML IJ SOLN
INTRAMUSCULAR | Status: DC | PRN
  Administered 2014-06-05 (×2): 80 ug via INTRAVENOUS

## 2014-06-05 MED ORDER — FENTANYL CITRATE 0.05 MG/ML IJ SOLN
INTRAMUSCULAR | Status: DC | PRN
  Administered 2014-06-05: 50 ug via INTRAVENOUS
  Administered 2014-06-05: 100 ug via INTRAVENOUS

## 2014-06-05 MED ORDER — ROCURONIUM BROMIDE 100 MG/10ML IV SOLN
INTRAVENOUS | Status: DC | PRN
  Administered 2014-06-05: 50 mg via INTRAVENOUS

## 2014-06-05 MED ORDER — ARTIFICIAL TEARS OP OINT
TOPICAL_OINTMENT | OPHTHALMIC | Status: AC
  Filled 2014-06-05: qty 3.5

## 2014-06-05 MED ORDER — MIDAZOLAM HCL 2 MG/2ML IJ SOLN
INTRAMUSCULAR | Status: AC
  Filled 2014-06-05: qty 2

## 2014-06-05 MED ORDER — BUPIVACAINE-EPINEPHRINE (PF) 0.25% -1:200000 IJ SOLN
INTRAMUSCULAR | Status: AC
  Filled 2014-06-05: qty 30

## 2014-06-05 MED ORDER — SODIUM CHLORIDE 0.9 % IJ SOLN
INTRAMUSCULAR | Status: AC
  Filled 2014-06-05: qty 10

## 2014-06-05 MED ORDER — LIDOCAINE HCL (CARDIAC) 20 MG/ML IV SOLN
INTRAVENOUS | Status: AC
  Filled 2014-06-05: qty 10

## 2014-06-05 MED ORDER — OXYCODONE HCL 5 MG/5ML PO SOLN: 5.0000 mg | Freq: Once | ORAL | Status: DC | PRN

## 2014-06-05 MED ORDER — LACTATED RINGERS IV SOLN
INTRAVENOUS | Status: DC | PRN
  Administered 2014-06-05 (×2): via INTRAVENOUS

## 2014-06-05 MED ORDER — OXYCODONE HCL 5 MG PO TABS: 5.0000 mg | ORAL_TABLET | Freq: Once | ORAL | Status: DC | PRN

## 2014-06-05 MED ORDER — LIDOCAINE HCL (CARDIAC) 20 MG/ML IV SOLN
INTRAVENOUS | Status: DC | PRN
  Administered 2014-06-05: 100 mg via INTRAVENOUS

## 2014-06-05 MED ORDER — BUPIVACAINE-EPINEPHRINE 0.25% -1:200000 IJ SOLN
INTRAMUSCULAR | Status: DC | PRN
  Administered 2014-06-05: 30 mL

## 2014-06-05 MED ORDER — MEPERIDINE HCL 25 MG/ML IJ SOLN: 6.2500 mg | INTRAMUSCULAR | Status: DC | PRN

## 2014-06-05 SURGICAL SUPPLY — 44 items
BLADE SURG ROTATE 9660 (MISCELLANEOUS) ×2 IMPLANT
CHLORAPREP W/TINT 26ML (MISCELLANEOUS) ×2 IMPLANT
COVER SURGICAL LIGHT HANDLE (MISCELLANEOUS) ×2 IMPLANT
DERMABOND ADVANCED (GAUZE/BANDAGES/DRESSINGS) ×1
DERMABOND ADVANCED .7 DNX12 (GAUZE/BANDAGES/DRESSINGS) ×1 IMPLANT
DRAIN PENROSE 1/2X12 LTX STRL (WOUND CARE) ×2 IMPLANT
DRAPE LAPAROTOMY TRNSV 102X78 (DRAPE) ×2 IMPLANT
DRAPE UTILITY 15X26 W/TAPE STR (DRAPE) ×4 IMPLANT
ELECT CAUTERY BLADE 6.4 (BLADE) ×2 IMPLANT
ELECT REM PT RETURN 9FT ADLT (ELECTROSURGICAL) ×2
ELECTRODE REM PT RTRN 9FT ADLT (ELECTROSURGICAL) ×1 IMPLANT
GAUZE SPONGE 4X4 16PLY XRAY LF (GAUZE/BANDAGES/DRESSINGS) ×4 IMPLANT
GLOVE BIO SURGEON STRL SZ 6.5 (GLOVE) ×2 IMPLANT
GLOVE BIO SURGEON STRL SZ7 (GLOVE) ×2 IMPLANT
GLOVE BIO SURGEON STRL SZ7.5 (GLOVE) ×4 IMPLANT
GLOVE BIOGEL PI IND STRL 8 (GLOVE) ×1 IMPLANT
GLOVE BIOGEL PI INDICATOR 8 (GLOVE) ×1
GLOVE SS BIOGEL STRL SZ 6.5 (GLOVE) ×1 IMPLANT
GLOVE SUPERSENSE BIOGEL SZ 6.5 (GLOVE) ×1
GOWN STRL REUS W/ TWL LRG LVL3 (GOWN DISPOSABLE) ×1 IMPLANT
GOWN STRL REUS W/ TWL XL LVL3 (GOWN DISPOSABLE) ×1 IMPLANT
GOWN STRL REUS W/TWL LRG LVL3 (GOWN DISPOSABLE) ×1
GOWN STRL REUS W/TWL XL LVL3 (GOWN DISPOSABLE) ×1
KIT BASIN OR (CUSTOM PROCEDURE TRAY) ×2 IMPLANT
KIT ROOM TURNOVER OR (KITS) ×2 IMPLANT
MESH PARIETEX PROGRIP RIGHT (Mesh General) ×2 IMPLANT
NEEDLE HYPO 25GX1X1/2 BEV (NEEDLE) ×2 IMPLANT
NS IRRIG 1000ML POUR BTL (IV SOLUTION) ×2 IMPLANT
PACK SURGICAL SETUP 50X90 (CUSTOM PROCEDURE TRAY) ×2 IMPLANT
PAD ARMBOARD 7.5X6 YLW CONV (MISCELLANEOUS) ×4 IMPLANT
PENCIL BUTTON HOLSTER BLD 10FT (ELECTRODE) ×2 IMPLANT
SPONGE LAP 18X18 X RAY DECT (DISPOSABLE) ×2 IMPLANT
SUT MNCRL AB 4-0 PS2 18 (SUTURE) ×2 IMPLANT
SUT PROLENE 2 0 SH DA (SUTURE) ×2 IMPLANT
SUT SILK 0 FSL (SUTURE) ×2 IMPLANT
SUT VIC AB 2-0 SH 27 (SUTURE) ×2
SUT VIC AB 2-0 SH 27X BRD (SUTURE) ×2 IMPLANT
SUT VIC AB 3-0 SH 27 (SUTURE) ×1
SUT VIC AB 3-0 SH 27XBRD (SUTURE) ×1 IMPLANT
SUT VICRYL AB 2 0 TIES (SUTURE) ×2 IMPLANT
SYR CONTROL 10ML LL (SYRINGE) ×2 IMPLANT
TOWEL OR 17X24 6PK STRL BLUE (TOWEL DISPOSABLE) ×2 IMPLANT
TOWEL OR 17X26 10 PK STRL BLUE (TOWEL DISPOSABLE) ×2 IMPLANT
TRAY FOLEY CATH 16FR SILVER (SET/KITS/TRAYS/PACK) ×2 IMPLANT

## 2014-06-05 NOTE — H&P (Signed)
HPI  Tony Silva is a 53 y.o. male. The patient is a 53 year old male who is referred by Dr. Jarold Song for evaluation of a right inguinal hernia. The patient states she's had increasing pain over the last several years. He does have a history of a left femoral hernia that was fixed at the age of 4 and 67. The patient has a easily retractable practice will however them to scrotum stone.  Patient has a history of A. Fib for which he takes diltiazem H. Asher bouts of A. Fib every several months. He is not on any blood thinners.  HPI  Past Medical History   Diagnosis  Date   .  Afib    .  Hypertension     Past Surgical History   Procedure  Laterality  Date   .  Hernia repair   1979    Family History   Problem  Relation  Age of Onset   .  COPD  Mother    .  COPD  Father    Social History  History   Substance Use Topics   .  Smoking status:  Never Smoker   .  Smokeless tobacco:  Not on file   .  Alcohol Use:  Yes   No Known Allergies  Current Outpatient Prescriptions   Medication  Sig  Dispense  Refill   .  diltiazem (CARDIZEM) 120 MG tablet  Take 120 mg by mouth daily.     .  fish oil-omega-3 fatty acids 1000 MG capsule  Take 2 g by mouth daily.     .  fluticasone (FLONASE) 50 MCG/ACT nasal spray  PLACE 2 SPRAYS INTO THE NOSE DAILY.  48 g  3   .  MULTIPLE VITAMIN PO  Take by mouth.     .  rosuvastatin (CRESTOR) 10 MG tablet  Take 1 tablet (10 mg total) by mouth once a week.  30 tablet  1   .  scopolamine (TRANSDERM-SCOP) 1.5 MG  Place 1 patch (1.5 mg total) onto the skin every 3 (three) days.  10 patch  0   .  zoster vaccine live, PF, (ZOSTAVAX) 84132 UNT/0.65ML injection  Inject 19,400 Units into the skin once.  1 each  0    No current facility-administered medications for this visit.   Review of Systems  Review of Systems  Constitutional: Negative.  HENT: Negative.  Eyes: Negative.  Respiratory: Negative.  Cardiovascular: Negative.  Gastrointestinal: Negative.  Endocrine:  Negative.  Neurological: Negative.  BP 133/93  Pulse 63  Temp(Src) 97.1 F (36.2 C) (Oral)  Ht 6' (1.829 m)  Wt 192 lb (87.091 kg)  BMI 26.03 kg/m2  SpO2 100%  Physical Exam  Physical Exam  Constitutional: He is oriented to person, place, and time. He appears well-developed and well-nourished.  HENT:  Head: Normocephalic and atraumatic.  Eyes: Conjunctivae and EOM are normal. Pupils are equal, round, and reactive to light.  Neck: Normal range of motion. Neck supple.  Cardiovascular: Normal rate, regular rhythm and normal heart sounds.  Pulmonary/Chest: Effort normal and breath sounds normal.  Abdominal: A hernia is present. Hernia confirmed positive in the right inguinal area. Hernia confirmed negative in the left inguinal area.    Healed LIHR incision  Musculoskeletal: Normal range of motion.  Neurological: He is alert and oriented to person, place, and time.  Skin: Skin is warm and dry.  Data Reviewed  none  Assessment  53 year old male with a right inguinal hernia  Plan  1. We'll proceed to the operating room for an open right inguinal hernia repair with mesh  2.All risks and benefits were discussed with the patient, to generally include infection, bleeding, damage to surrounding structures, acute and chronic nerve pain, and recurrence. Alternatives were offered and described. All questions were answered and the patient voiced understanding of the procedure and wishes to proceed at this point.

## 2014-06-05 NOTE — Discharge Instructions (Signed)
[facility] _______[facility] ° °INGUINAL HERNIA REPAIR: POST OP INSTRUCTIONS ° °Always review your discharge instruction sheet given to you by the facility where your surgery was performed. °IF YOU HAVE DISABILITY OR FAMILY LEAVE FORMS, YOU MUST BRING THEM TO THE OFFICE FOR PROCESSING.   °DO NOT GIVE THEM TO YOUR DOCTOR. ° °1. A  prescription for pain medication may be given to you upon discharge.  Take your pain medication as prescribed, if needed.  If narcotic pain medicine is not needed, then you may take acetaminophen (Tylenol) or ibuprofen (Advil) as needed. °2. Take your usually prescribed medications unless otherwise directed. °3. If you need a refill on your pain medication, please contact your pharmacy.  They will contact our office to request authorization. Prescriptions will not be filled after 5 pm or on week-ends. °4. You should follow a light diet the first 24 hours after arrival home, such as soup and crackers, etc.  Be sure to include lots of fluids daily.  Resume your normal diet the day after surgery. °5. Most patients will experience some swelling and bruising around the umbilicus or in the groin and scrotum.  Ice packs and reclining will help.  Swelling and bruising can take several days to resolve.  °6. It is common to experience some constipation if taking pain medication after surgery.  Increasing fluid intake and taking a stool softener (such as Colace) will usually help or prevent this problem from occurring.  A mild laxative (Milk of Magnesia or Miralax) should be taken according to package directions if there are no bowel movements after 48 hours. °7. Unless discharge instructions indicate otherwise, you may remove your bandages 24-48 hours after surgery, and you may shower at that time.  You may have steri-strips (small skin tapes) in place directly over the incision.  These strips should be left on the skin for 7-10 days.  If your surgeon used skin glue on the incision, you  may shower in 24 hours.  The glue will flake off over the next 2-3 weeks.  Any sutures or staples will be removed at the office during your follow-up visit. °8. ACTIVITIES:  You may resume regular (light) daily activities beginning the next day--such as daily self-care, walking, climbing stairs--gradually increasing activities as tolerated.  You may have sexual intercourse when it is comfortable.  Refrain from any heavy lifting or straining until approved by your doctor. °a. You may drive when you are no longer taking prescription pain medication, you can comfortably wear a seatbelt, and you can safely maneuver your car and apply brakes. °b. RETURN TO WORK:  __________________________________________________________ °9. You should see your doctor in the office for a follow-up appointment approximately 2-3 weeks after your surgery.  Make sure that you call for this appointment within a day or two after you arrive home to insure a convenient appointment time. °10. OTHER INSTRUCTIONS:  __________________________________________________________________________________________________________________________________________________________________________________________  °WHEN TO CALL YOUR DOCTOR: °1. Fever over 101.0 °2. Inability to urinate °3. Nausea and/or vomiting °4. Extreme swelling or bruising °5. Continued bleeding from incision. °6. Increased pain, redness, or drainage from the incision ° °The clinic staff is available to answer your questions during regular business hours.  Please don’t hesitate to call and ask to speak to one of the nurses for clinical concerns.  If you have a medical emergency, go to the nearest emergency room or call 911.  A surgeon from [facility] is always on call at the hospital ° ° °[address], [address], [place], [place]  [address] ? ° [address] [address], [place], [place]   [address] °[phone] ? [phone] ? FAX [phone] °Web site:  [email or web address] ° °

## 2014-06-05 NOTE — Op Note (Signed)
06/05/2014  11:01 AM  PATIENT:  Tony Silva  53 y.o. male  PRE-OPERATIVE DIAGNOSIS:  Right inguinal hernia  POST-OPERATIVE DIAGNOSIS:  Right inguinal hernia  PROCEDURE:  Procedure(s): OPEN RIGHT INGUINAL HERNIA REPAIR (Right) INSERTION OF MESH (Right)  SURGEON:  Surgeon(s) and Role:    * Ralene Ok, MD - Primary  PHYSICIAN ASSISTANT:   ASSISTANTS: Carney Corners, PA-student   ANESTHESIA:   local and general  EBL:  Total I/O In: 1100 [I.V.:1100] Out: 10 [Blood:10]  BLOOD ADMINISTERED:none  DRAINS: none   LOCAL MEDICATIONS USED:  BUPIVICAINE   SPECIMEN:  Hernia sac  DISPOSITION OF SPECIMEN:  PATHOLOGY  COUNTS:  YES  TOURNIQUET:  * No tourniquets in log *  DICTATION: .Dragon Dictation  Details of the procedure: The patient was taken back to the operating room. The patient was placed in supine position with bilateral SCDs in place. After appropriate anitbiotics were confirmed, a time-out was confirmed and all facts were verified.  . A 5 cm incision was made just 1 cm superior to the inguinal ligament. Bovie cautery was used to maintain hemostasis dissection is carried down to the external oblique.  A standard incision was made laterally, and the external oblique was bluntly dissected away from the surrounding tissue with Metzenbaum scissors. Incision was made through the external oblique and lateral stab wound through the external ring. The external oblique was elevated in the spermatic cord was bluntly dissected away from the surrounding tissue.  The ilioinguinal nerve was identified and ligated proximally with a 2-0 vicryl. The spermatic cord and the hernia were then bluntly dissected away from the pubic tubercle and a Penrose was placed around the hernia sac in the spermatic cord. Dissection cremasterics took place with a Bovie cautery it it was cleared away and the hernia saw was clearly identified. The hernia sac was encountered and entered laterally.  There was no  sliding component to the hernia.  There was no femoral hernia palpated. The hernia sac was highly ligated using 0 silk. This retracted into the abdomen in the usual fashion.  A piece of Progrip mesh was then anchored to the pubic tubercle with a 2-0 Prolene.  It was anchored to the shelving edge of the external oblique x 1 as well as the conjoint tendon near the pubic tubercle x 1.  The mesh was brought around the cord, and layed down in the usual fashion. The wrap was than anchored to the conjoint tendon to prevent slip.  The tail was then cut into shape and tucked under the external oblique. The mesh surrounding the spermatic cord at the internal oblique was then tightened to allow approximately 1 fingerbreadth. At this time the area was irrigated out with sterile saline.  The external oblique was reapproximated using a 2-0 Vicryl in a running fashion. Scarpa's fascia was then reapproximated using a 3-0 Vicryl running fashion. The skin was then reapproximated with 4 Monocryl in a subcuticular fashion. The skin was then dressed with Dermabond.  The patient was taken to the recovery room in stable condition.  PLAN OF CARE: Discharge to home after PACU  PATIENT DISPOSITION:  PACU - hemodynamically stable.   Delay start of Pharmacological VTE agent (>24hrs) due to surgical blood loss or risk of bleeding: not applicable

## 2014-06-05 NOTE — Anesthesia Preprocedure Evaluation (Addendum)
Anesthesia Evaluation  Patient identified by MRN, date of birth, ID band Patient awake    Reviewed: Allergy & Precautions, H&P , NPO status , Patient's Chart, lab work & pertinent test results  Airway Mallampati: II      Dental  (+) Teeth Intact, Dental Advidsory Given   Pulmonary          Cardiovascular hypertension, On Medications Rhythm:regular     Neuro/Psych    GI/Hepatic   Endo/Other    Renal/GU      Musculoskeletal   Abdominal   Peds  Hematology   Anesthesia Other Findings   Reproductive/Obstetrics                          Anesthesia Physical Anesthesia Plan  ASA: II  Anesthesia Plan: General ETT   Post-op Pain Management:    Induction:   Airway Management Planned:   Additional Equipment:   Intra-op Plan:   Post-operative Plan:   Informed Consent:   Dental Advisory Given  Plan Discussed with: Anesthesiologist and CRNA  Anesthesia Plan Comments:         Anesthesia Quick Evaluation

## 2014-06-05 NOTE — Anesthesia Procedure Notes (Addendum)
Anesthesia Regional Block:  TAP block  Pre-Anesthetic Checklist: ,, timeout performed, Correct Patient, Correct Site, Correct Laterality, Correct Procedure, Correct Position, site marked, Risks and benefits discussed,  Surgical consent,  Pre-op evaluation,  At surgeon's request and post-op pain management  Laterality: Right  Prep: chloraprep and alcohol swabs       Needles:  Injection technique: Single-shot  Needle Type: Echogenic Stimulator Needle     Needle Length: 9cm 9 cm Needle Gauge: 21 and 21 G    Additional Needles:  Procedures: ultrasound guided (picture in chart) TAP block Narrative:  Start time: 06/05/2014 9:35 AM End time: 06/05/2014 9:45 AM Injection made incrementally with aspirations every 5 mL.  Performed by: Personally  Anesthesiologist: Lillia Abed MD  Additional Notes: Monitors applied. Patient sedated. Sterile prep and drape,hand hygiene and sterile gloves were used. Relevant anatomy identified.Needle position confirmed.Local anesthetic injected incrementally after negative aspiration. Local anesthetic spread visualized in Transversus Abdominus Plane. Vascular puncture avoided. No complications. Image printed for medical record.The patient tolerated the procedure well.

## 2014-06-05 NOTE — Transfer of Care (Signed)
Immediate Anesthesia Transfer of Care Note  Patient: Tony Silva  Procedure(s) Performed: Procedure(s): OPEN RIGHT INGUINAL HERNIA REPAIR (Right) INSERTION OF MESH (Right)  Patient Location: PACU  Anesthesia Type:General  Level of Consciousness: awake, alert  and oriented  Airway & Oxygen Therapy: Patient Spontanous Breathing and Patient connected to nasal cannula oxygen  Post-op Assessment: Report given to PACU RN, Post -op Vital signs reviewed and stable and Patient moving all extremities X 4  Post vital signs: Reviewed and stable  Complications: No apparent anesthesia complications

## 2014-06-05 NOTE — Anesthesia Postprocedure Evaluation (Signed)
  Anesthesia Post-op Note  Patient: Tony Silva  Procedure(s) Performed: Procedure(s): OPEN RIGHT INGUINAL HERNIA REPAIR (Right) INSERTION OF MESH (Right)  Patient Location: PACU  Anesthesia Type:GA combined with regional for post-op pain  Level of Consciousness: awake, alert , oriented and patient cooperative  Airway and Oxygen Therapy: Patient Spontanous Breathing and Patient connected to nasal cannula oxygen  Post-op Pain: none  Post-op Assessment: Post-op Vital signs reviewed, Patient's Cardiovascular Status Stable, Respiratory Function Stable, Patent Airway, No signs of Nausea or vomiting and Pain level controlled  Post-op Vital Signs: Reviewed and stable  Last Vitals:  Filed Vitals:   06/05/14 1121  BP:   Pulse: 58  Temp:   Resp: 18    Complications: No apparent anesthesia complications

## 2014-06-08 ENCOUNTER — Encounter (HOSPITAL_COMMUNITY): Payer: Self-pay | Admitting: General Surgery

## 2014-06-13 ENCOUNTER — Ambulatory Visit (INDEPENDENT_AMBULATORY_CARE_PROVIDER_SITE_OTHER): Payer: Commercial Managed Care - PPO | Admitting: General Surgery

## 2014-06-13 ENCOUNTER — Encounter (INDEPENDENT_AMBULATORY_CARE_PROVIDER_SITE_OTHER): Payer: Self-pay | Admitting: General Surgery

## 2014-06-13 VITALS — BP 132/94 | HR 75 | Temp 97.0°F | Ht 72.0 in | Wt 198.1 lb

## 2014-06-13 DIAGNOSIS — T8140XA Infection following a procedure, unspecified, initial encounter: Secondary | ICD-10-CM

## 2014-06-13 NOTE — Progress Notes (Signed)
Patient ID: Tony Silva, male   DOB: Sep 07, 1961, 54 y.o.   MRN: [id number] Post op course The patient is a 53 year old male status post open right inguinal hernia repair with mesh. Patient has been doing well postoperatively. He had minimal pain postoperatively.  On Exam: His wounds clean dry and intact, there is a healing ridge, there is no hernial palpation   Assessment and Plan 53 year old male status post open right inguinal hernia repair with Mesh 1. We discussed weightlifting restrictions for another month. 2. The patient follow up as needed   Ralene Ok, MD [facility] General & Minimally Invasive Surgery Trauma & Emergency Surgery

## 2014-06-21 ENCOUNTER — Encounter (INDEPENDENT_AMBULATORY_CARE_PROVIDER_SITE_OTHER): Payer: Commercial Managed Care - PPO | Admitting: General Surgery

## 2014-07-12 ENCOUNTER — Other Ambulatory Visit: Payer: Self-pay | Admitting: *Deleted

## 2014-07-12 NOTE — Telephone Encounter (Addendum)
Dr. Marlou Porch, pt was last seen 7/13. Ok to refill or does pt need appt first?

## 2014-07-13 ENCOUNTER — Other Ambulatory Visit: Payer: Self-pay | Admitting: *Deleted

## 2014-07-13 ENCOUNTER — Encounter: Payer: Self-pay | Admitting: Family Medicine

## 2014-07-14 ENCOUNTER — Telehealth: Payer: Self-pay | Admitting: Family Medicine

## 2014-07-14 ENCOUNTER — Other Ambulatory Visit: Payer: Self-pay

## 2014-07-14 MED ORDER — DILTIAZEM HCL ER 120 MG PO CP12: 120.0000 mg | ORAL_CAPSULE | Freq: Every day | ORAL | Status: DC

## 2014-07-14 MED ORDER — DILTIAZEM HCL 120 MG PO TABS: 120.0000 mg | ORAL_TABLET | Freq: Every day | ORAL | Status: DC

## 2014-07-14 MED ORDER — DILTIAZEM HCL ER 120 MG PO CP24: 120.0000 mg | ORAL_CAPSULE | Freq: Every day | ORAL | Status: DC

## 2014-07-14 NOTE — Telephone Encounter (Signed)
Tony Silva, [place] pt normally get ER diltiazem (CARDIZEM) 120 MG tablet 1 per day but the RX sent in was for regular.  She said the original RX was filled by Dr. Marlou Porch and she would like to know if you want them to fill the regular release or have the RX changed to ER??

## 2014-07-14 NOTE — Telephone Encounter (Signed)
Change to ER-stay consistent with what he has been taking.

## 2014-07-14 NOTE — Telephone Encounter (Signed)
Rx sent to pharmacy   

## 2014-07-14 NOTE — Telephone Encounter (Signed)
OK to refil. Make sure he has upcoming appt though. Thanks.

## 2014-07-17 ENCOUNTER — Other Ambulatory Visit: Payer: Self-pay

## 2014-07-17 MED ORDER — DILTIAZEM HCL ER 120 MG PO CP24: 120.0000 mg | ORAL_CAPSULE | Freq: Every day | ORAL | Status: DC

## 2014-08-02 ENCOUNTER — Encounter: Payer: Self-pay | Admitting: Family Medicine

## 2014-08-07 ENCOUNTER — Telehealth: Payer: Self-pay | Admitting: Gastroenterology

## 2014-09-06 NOTE — Telephone Encounter (Signed)
Pt advised needs colonoscopy in 2018.  Records sent to be scanned

## 2014-10-27 DIAGNOSIS — I452 Bifascicular block: Secondary | ICD-10-CM

## 2014-10-27 HISTORY — DX: Bifascicular block: I45.2

## 2015-01-25 ENCOUNTER — Other Ambulatory Visit: Payer: Self-pay | Admitting: Family Medicine

## 2015-02-01 ENCOUNTER — Encounter: Payer: Self-pay | Admitting: Family Medicine

## 2015-02-01 ENCOUNTER — Ambulatory Visit (INDEPENDENT_AMBULATORY_CARE_PROVIDER_SITE_OTHER): Payer: 59 | Admitting: Family Medicine

## 2015-02-01 VITALS — BP 128/82 | HR 60 | Temp 98.0°F | Ht 72.0 in | Wt 196.0 lb

## 2015-02-01 DIAGNOSIS — Z0000 Encounter for general adult medical examination without abnormal findings: Secondary | ICD-10-CM | POA: Diagnosis not present

## 2015-02-01 DIAGNOSIS — Z125 Encounter for screening for malignant neoplasm of prostate: Secondary | ICD-10-CM

## 2015-02-01 LAB — LIPID PANEL
CHOLESTEROL: 219 mg/dL — AB (ref 0–200)
HDL: 64.8 mg/dL (ref >39.00)
LDL CALC: 131 mg/dL — AB (ref 0–99)
NonHDL: 154.2
TRIGLYCERIDES: 116 mg/dL (ref 0.0–149.0)
Total CHOL/HDL Ratio: 3
VLDL: 23.2 mg/dL (ref 0.0–40.0)

## 2015-02-01 LAB — CBC WITH DIFFERENTIAL/PLATELET
Basophils Absolute: 0 10*3/uL (ref 0.0–0.1)
Basophils Relative: 0.7 % (ref 0.0–3.0)
EOS ABS: 0.1 10*3/uL (ref 0.0–0.7)
Eosinophils Relative: 2.4 % (ref 0.0–5.0)
HCT: 47.1 % (ref 39.0–52.0)
Hemoglobin: 16 g/dL (ref 13.0–17.0)
Lymphocytes Relative: 32.8 % (ref 12.0–46.0)
Lymphs Abs: 1.4 10*3/uL (ref 0.7–4.0)
MCHC: 34.1 g/dL (ref 30.0–36.0)
MCV: 94.5 fl (ref 78.0–100.0)
MONO ABS: 0.4 10*3/uL (ref 0.1–1.0)
Monocytes Relative: 10.2 % (ref 3.0–12.0)
Neutro Abs: 2.3 10*3/uL (ref 1.4–7.7)
Neutrophils Relative %: 53.9 % (ref 43.0–77.0)
PLATELETS: 267 10*3/uL (ref 150.0–400.0)
RBC: 4.98 Mil/uL (ref 4.22–5.81)
RDW: 13.1 % (ref 11.5–15.5)
WBC: 4.2 10*3/uL (ref 4.0–10.5)

## 2015-02-01 LAB — TSH: TSH: 2.22 u[IU]/mL (ref 0.35–4.50)

## 2015-02-01 LAB — BASIC METABOLIC PANEL
BUN: 15 mg/dL (ref 6–23)
CHLORIDE: 103 meq/L (ref 96–112)
CO2: 30 meq/L (ref 19–32)
Calcium: 9.6 mg/dL (ref 8.4–10.5)
Creatinine, Ser: 0.85 mg/dL (ref 0.40–1.50)
GFR: 99.26 mL/min (ref >60.00)
GLUCOSE: 91 mg/dL (ref 70–99)
Potassium: 4 mEq/L (ref 3.5–5.1)
Sodium: 140 mEq/L (ref 135–145)

## 2015-02-01 LAB — HEPATIC FUNCTION PANEL
ALK PHOS: 55 U/L (ref 39–117)
ALT: 34 U/L (ref 0–53)
AST: 25 U/L (ref 0–37)
Albumin: 4.9 g/dL (ref 3.5–5.2)
BILIRUBIN DIRECT: 0.1 mg/dL (ref 0.0–0.3)
Total Bilirubin: 0.7 mg/dL (ref 0.2–1.2)
Total Protein: 7.5 g/dL (ref 6.0–8.3)

## 2015-02-01 LAB — POCT URINALYSIS DIPSTICK
Bilirubin, UA: NEGATIVE
Blood, UA: NEGATIVE
Glucose, UA: NEGATIVE
KETONES UA: NEGATIVE
Leukocytes, UA: NEGATIVE
Nitrite, UA: NEGATIVE
PH UA: 7
Protein, UA: NEGATIVE
SPEC GRAV UA: 1.01
Urobilinogen, UA: 0.2

## 2015-02-01 LAB — PSA: PSA: 1.71 ng/mL (ref 0.10–4.00)

## 2015-02-01 MED ORDER — DILTIAZEM HCL ER 120 MG PO CP24: 120.0000 mg | ORAL_CAPSULE | Freq: Every day | ORAL | Status: DC

## 2015-02-01 MED ORDER — ROSUVASTATIN CALCIUM 10 MG PO TABS: 10.0000 mg | ORAL_TABLET | ORAL | Status: DC

## 2015-02-01 NOTE — Progress Notes (Signed)
Subjective:    Patient ID: Tony Silva, Tony Silva    DOB: [birth year], 54 y.o.   MRN: [id number]  HPI  Patient seen for complete physical. Generally very healthy. He has history of lone atrial fibrillation and he takes low-dose diltiazem at baseline. When he has episodes which are sometimes triggered by dehydration or alcohol he takes an extra diltiazem. His episodes of atrial fibrillation are usually very short-lived usually lasting about 30-45 minutes. He had recent calcium score of 0. No chest pains whatsoever. He takes very low-dose Crestor 10 mg once weekly. Could not tolerate higher dosing. Right inguinal hernia repair during the past year which went well with no signs of recurrence. Tetanus up-to-date. Due for repeat colonoscopy in 2 years.  Past Medical History  Diagnosis Date  . Afib   . Hypertension   . High cholesterol    Past Surgical History  Procedure Laterality Date  . Hernia repair  1979    left  . Eye surgery      lasik 2001  . Inguinal hernia repair Right 06/05/2014    Procedure: OPEN RIGHT INGUINAL HERNIA REPAIR;  Surgeon: Ralene Ok, MD;  Location: [place];  Service: General;  Laterality: Right;  . Insertion of mesh Right 06/05/2014    Procedure: INSERTION OF MESH;  Surgeon: Ralene Ok, MD;  Location: [facility];  Service: General;  Laterality: Right;    reports that he has never smoked. He does not have any smokeless tobacco history on file. He reports that he drinks about 6.0 oz of alcohol per week. He reports that he does not use illicit drugs. family history includes COPD in his father and mother. Allergies  Allergen Reactions  . Codeine Nausea Only      Review of Systems  Constitutional: Negative for fever, activity change, appetite change, fatigue and unexpected weight change.  HENT: Negative for congestion, ear pain and trouble swallowing.   Eyes: Negative for pain and visual disturbance.  Respiratory: Negative for cough, shortness of breath and  wheezing.   Cardiovascular: Negative for chest pain and palpitations.  Gastrointestinal: Negative for nausea, vomiting, abdominal pain, diarrhea, constipation, blood in stool, abdominal distention and rectal pain.  Endocrine: Negative for polydipsia and polyuria.  Genitourinary: Positive for dysuria and frequency. Negative for hematuria and testicular pain.  Musculoskeletal: Negative for joint swelling and arthralgias.  Skin: Negative for rash.  Neurological: Negative for dizziness, syncope and headaches.  Hematological: Negative for adenopathy.  Psychiatric/Behavioral: Negative for confusion and dysphoric mood.       Objective:   Physical Exam  Constitutional: He is oriented to person, place, and time. He appears well-developed and well-nourished. No distress.  HENT:  Head: Normocephalic and atraumatic.  Right Ear: External ear normal.  Left Ear: External ear normal.  Mouth/Throat: Oropharynx is clear and moist.  Eyes: Conjunctivae and EOM are normal. Pupils are equal, round, and reactive to light.  Neck: Normal range of motion. Neck supple. No thyromegaly present.  Cardiovascular: Normal rate, regular rhythm and normal heart sounds.   No murmur heard. Pulmonary/Chest: No respiratory distress. He has no wheezes. He has no rales.  Abdominal: Soft. Bowel sounds are normal. He exhibits no distension and no mass. There is no tenderness. There is no rebound and no guarding.  Musculoskeletal: He exhibits no edema.  Lymphadenopathy:    He has no cervical adenopathy.  Neurological: He is alert and oriented to person, place, and time. He displays normal reflexes. No cranial nerve deficit.  Skin: No  rash noted.  Psychiatric: He has a normal mood and affect.          Assessment & Plan:  Complete physical. Obtain screening lab work. Will include PSA. We'll also include urinalysis as he's had some mild frequency over the past couple weeks with rare episodes of mild burning.

## 2015-02-01 NOTE — Progress Notes (Signed)
Pre visit review using our clinic review tool, if applicable. No additional management support is needed unless otherwise documented below in the visit note. 

## 2015-04-11 ENCOUNTER — Encounter: Payer: Self-pay | Admitting: Family Medicine

## 2015-04-11 ENCOUNTER — Other Ambulatory Visit: Payer: Self-pay

## 2015-04-11 MED ORDER — ACYCLOVIR 200 MG PO CAPS: 200.0000 mg | ORAL_CAPSULE | Freq: Every day | ORAL | Status: DC

## 2015-06-18 ENCOUNTER — Other Ambulatory Visit: Payer: Self-pay | Admitting: *Deleted

## 2015-06-18 MED ORDER — FLUTICASONE PROPIONATE 50 MCG/ACT NA SUSP: NASAL | Status: DC

## 2015-11-02 DIAGNOSIS — F4321 Adjustment disorder with depressed mood: Secondary | ICD-10-CM | POA: Diagnosis not present

## 2015-11-09 DIAGNOSIS — F4321 Adjustment disorder with depressed mood: Secondary | ICD-10-CM | POA: Diagnosis not present

## 2015-11-21 DIAGNOSIS — F4321 Adjustment disorder with depressed mood: Secondary | ICD-10-CM | POA: Diagnosis not present

## 2015-12-04 ENCOUNTER — Encounter: Payer: Self-pay | Admitting: Family Medicine

## 2015-12-11 ENCOUNTER — Ambulatory Visit (INDEPENDENT_AMBULATORY_CARE_PROVIDER_SITE_OTHER): Payer: 59 | Admitting: Family Medicine

## 2015-12-11 ENCOUNTER — Encounter: Payer: Self-pay | Admitting: Family Medicine

## 2015-12-11 VITALS — BP 130/88 | HR 86 | Temp 98.4°F | Ht 72.0 in | Wt 192.0 lb

## 2015-12-11 DIAGNOSIS — K921 Melena: Secondary | ICD-10-CM

## 2015-12-11 DIAGNOSIS — K602 Anal fissure, unspecified: Secondary | ICD-10-CM | POA: Diagnosis not present

## 2015-12-11 MED ORDER — DILTIAZEM HCL ER 120 MG PO CP24: 120.0000 mg | ORAL_CAPSULE | Freq: Every day | ORAL | Status: DC

## 2015-12-11 MED FILL — ROSUVASTATIN CALCIUM 10 MG: 10 | 84 days supply | Qty: 12 | Fill #4

## 2015-12-11 MED FILL — CARTIA XT 120 MG CAPSULE SA: 120 | 90 days supply | Qty: 90 | Fill #0

## 2015-12-11 NOTE — Progress Notes (Signed)
   Subjective:    Patient ID: Tony Silva, male    DOB: 1961-03-11, 55 y.o.   MRN: [id number]  HPI Patient seen with chief complaint of hematochezia intermittently over the past year-bright blood with wiping and also noted in toilet. Perhaps, more frequent over the past several weeks. He had colonoscopy at [facility] 2010 reportedly normal No family history of colon cancer or colon polyps. He describes bright blood with wiping and also sometimes in the toilet. Denies any appetite or weight changes. No stool changes. No pain with stools. No associated abdominal pain. No regular aspirin use.  Past Medical History  Diagnosis Date  . Afib ([place])   . Hypertension   . High cholesterol    Past Surgical History  Procedure Laterality Date  . Hernia repair  1979    left  . Eye surgery      lasik 2001  . Inguinal hernia repair Right 06/05/2014    Procedure: OPEN RIGHT INGUINAL HERNIA REPAIR;  Surgeon: Ralene Ok, MD;  Location: [place];  Service: General;  Laterality: Right;  . Insertion of mesh Right 06/05/2014    Procedure: INSERTION OF MESH;  Surgeon: Ralene Ok, MD;  Location: [facility];  Service: General;  Laterality: Right;    reports that he has never smoked. He does not have any smokeless tobacco history on file. He reports that he drinks about 6.0 oz of alcohol per week. He reports that he does not use illicit drugs. family history includes COPD in his father and mother. Allergies  Allergen Reactions  . Codeine Nausea Only       Review of Systems  Constitutional: Negative for fever, chills, appetite change, fatigue and unexpected weight change.  Respiratory: Negative for shortness of breath.   Cardiovascular: Negative for chest pain.  Gastrointestinal: Positive for blood in stool. Negative for nausea, vomiting, abdominal pain, diarrhea and constipation.  Neurological: Negative for dizziness.       Objective:   Physical Exam  Constitutional: He appears  well-developed and well-nourished.  Cardiovascular: Normal rate and regular rhythm.   Pulmonary/Chest: Effort normal and breath sounds normal. No respiratory distress. He has no wheezes. He has no rales.  Abdominal: Soft. There is no tenderness.  Genitourinary:  Patient has fairly large anal fissure around the 2:00 position without any active bleeding. No external hemorrhoids noted. Digital exam no rectal mass. Did not attempt endoscopy with large fissure above          Assessment & Plan:  Rectal bleeding. Very likely related to large atypical anal fissure. Atypical in location (around 2 O'clock position). He does not have any other worrisome red flag symptoms such as stool changes, appetite or weight changes, abdominal pain, etc. Consult with GI In the meantime, measures to reduce constipation with plenty of fluids, stool softener, increased fiber, sitz baths

## 2015-12-11 NOTE — Patient Instructions (Signed)
Anal Fissure, Adult °An anal fissure is a small tear or crack in the skin around the anus. Bleeding from a fissure usually stops on its own within a few minutes. However, bleeding will often occur again with each bowel movement until the crack heals. °CAUSES °This condition may be caused by: °· Passing large, hard stool (feces). °· Frequent diarrhea. °· Constipation. °· Inflammatory bowel disease (Crohn disease or ulcerative colitis). °· Infections. °· Anal sex. °SYMPTOMS °Symptoms of this condition include: °· Bleeding from the rectum. °· Small amounts of blood seen on your stool, on toilet paper, or in the toilet after a bowel movement. °· Painful bowel movements. °· Itching or irritation around the anus. °DIAGNOSIS  °A health care provider may diagnose this condition by closely examining the anal area. An anal fissure can usually be seen with careful inspection. In some cases, a rectal exam may be performed, or a short tube (anoscope) may be used to examine the anal canal. °TREATMENT °Treatment for this condition may include: °· Taking steps to avoid constipation. This may include making changes to your diet, such as increasing your intake of fiber or fluid. °· Taking fiber supplements. These supplements can soften your stool to help make bowel movements easier. Your health care provider may also prescribe a stool softener if your stool is often hard. °· Taking sitz baths. This may help to heal the tear. °· Using medicated creams or ointments. These may be prescribed to lessen discomfort. °HOME CARE INSTRUCTIONS °Eating and Drinking °· Avoid foods that may be constipating, such as bananas and dairy products. °· Drink enough fluid to keep your urine clear or pale yellow. °· Maintain a diet that is high in fruits, whole grains, and vegetables. °General Instructions °· Keep the anal area as clean and dry as possible. °· Take sitz baths as told by your health care provider. Do not use soap in the sitz baths. °· Take  over-the-counter and prescription medicines only as told by your health care provider. °· Use creams or ointments only as told by your health care provider. °· Keep all follow-up visits as told by your health care provider. This is important. °SEEK MEDICAL CARE IF: °· You have more bleeding. °· You have a fever. °· You have diarrhea that is mixed with blood. °· You continue to have pain. °· Your problem is getting worse rather than better. °  °This information is not intended to replace advice given to you by your health care provider. Make sure you discuss any questions you have with your health care provider. °  °Document Released: 10/13/2005 Document Revised: 07/04/2015 Document Reviewed: 01/08/2015 °Elsevier Interactive Patient Education ©2016 Elsevier Inc. ° °

## 2015-12-11 NOTE — Progress Notes (Signed)
Pre visit review using our clinic review tool, if applicable. No additional management support is needed unless otherwise documented below in the visit note. 

## 2015-12-26 ENCOUNTER — Encounter: Payer: Self-pay | Admitting: Family Medicine

## 2015-12-28 ENCOUNTER — Encounter: Payer: Self-pay | Admitting: *Deleted

## 2015-12-28 ENCOUNTER — Emergency Department
Admission: EM | Admit: 2015-12-28 | Discharge: 2015-12-28 | Disposition: A | Discharge status: Home / Self Care | Payer: 59 | Source: Home / Self Care | Attending: Family Medicine | Admitting: Family Medicine

## 2015-12-28 ENCOUNTER — Encounter: Payer: Self-pay | Admitting: Family Medicine

## 2015-12-28 DIAGNOSIS — R197 Diarrhea, unspecified: Secondary | ICD-10-CM

## 2015-12-28 DIAGNOSIS — A09 Infectious gastroenteritis and colitis, unspecified: Secondary | ICD-10-CM

## 2015-12-28 MED ORDER — CIPROFLOXACIN HCL 500 MG PO TABS: 500.0000 mg | ORAL_TABLET | Freq: Two times a day (BID) | ORAL | Status: DC

## 2015-12-28 MED FILL — CIPROFLOXACIN HCL 500 MG TA: 500 | 5 days supply | Qty: 10 | Fill #0

## 2015-12-28 NOTE — Discharge Instructions (Signed)
Begin clear liquids (Pedialyte while having diarrhea) until improved, then advance to a [employer] (Bananas, Rice, Applesauce, Toast).  Then gradually resume a regular diet when tolerated.  Avoid milk products until well.  When stools become more formed, may take Imodium (loperamide) once or twice daily to decrease stool frequency.  If symptoms become significantly worse during the night or over the weekend, proceed to the local emergency room.    Diarrhea Diarrhea is frequent loose and watery bowel movements. It can cause you to feel weak and dehydrated. Dehydration can cause you to become tired and thirsty, have a dry mouth, and have decreased urination that often is dark yellow. Diarrhea is a sign of another problem, most often an infection that will not last long. In most cases, diarrhea typically lasts 2-3 days. However, it can last longer if it is a sign of something more serious. It is important to treat your diarrhea as directed by your caregiver to lessen or prevent future episodes of diarrhea. CAUSES  Some common causes include:  Gastrointestinal infections caused by viruses, bacteria, or parasites.  Food poisoning or food allergies.  Certain medicines, such as antibiotics, chemotherapy, and laxatives.  Artificial sweeteners and fructose.  Digestive disorders. HOME CARE INSTRUCTIONS  Ensure adequate fluid intake (hydration): Have 1 cup (8 oz) of fluid for each diarrhea episode. Avoid fluids that contain simple sugars or sports drinks, fruit juices, whole milk products, and sodas. Your urine should be clear or pale yellow if you are drinking enough fluids. Hydrate with an oral rehydration solution that you can purchase at pharmacies, retail stores, and online. You can prepare an oral rehydration solution at home by mixing the following ingredients together:   - tsp table salt.   tsp baking soda.   tsp salt substitute containing potassium chloride.  1  tablespoons sugar.  1 L  (34 oz) of water.  Certain foods and beverages may increase the speed at which food moves through the gastrointestinal (GI) tract. These foods and beverages should be avoided and include:  Caffeinated and alcoholic beverages.  High-fiber foods, such as raw fruits and vegetables, nuts, seeds, and whole grain breads and cereals.  Foods and beverages sweetened with sugar alcohols, such as xylitol, sorbitol, and mannitol.  Some foods may be well tolerated and may help thicken stool including:  Starchy foods, such as rice, toast, pasta, low-sugar cereal, oatmeal, grits, baked potatoes, crackers, and bagels.  Bananas.  Applesauce.  Add probiotic-rich foods to help increase healthy bacteria in the GI tract, such as yogurt and fermented milk products.  Wash your hands well after each diarrhea episode.  Only take over-the-counter or prescription medicines as directed by your caregiver.  Take a warm bath to relieve any burning or pain from frequent diarrhea episodes. SEEK IMMEDIATE MEDICAL CARE IF:   You are unable to keep fluids down.  You have persistent vomiting.  You have blood in your stool, or your stools are black and tarry.  You do not urinate in 6-8 hours, or there is only a small amount of very dark urine.  You have abdominal pain that increases or localizes.  You have weakness, dizziness, confusion, or light-headedness.  You have a severe headache.  Your diarrhea gets worse or does not get better.  You have a fever or persistent symptoms for more than 2-3 days.  You have a fever and your symptoms suddenly get worse. MAKE SURE YOU:   Understand these instructions.  Will watch your condition.  Will  get help right away if you are not doing well or get worse.   This information is not intended to replace advice given to you by your health care provider. Make sure you discuss any questions you have with your health care provider.   Document Released: 10/03/2002  Document Revised: 11/03/2014 Document Reviewed: 06/20/2012 Elsevier Interactive Patient Education [employer].

## 2015-12-28 NOTE — ED Provider Notes (Signed)
CSN: [id number]     Arrival date & time 12/28/15  1053 History   First MD Initiated Contact with Patient 12/28/15 1228     Chief Complaint  Patient presents with  . Diarrhea  . Abdominal Pain      HPI Comments: Patient reports that he had recently returned from a 10 day trip to [place].  On his day of departure four days ago, he ate breakfast with several other travelling partners.  Everyone, including the patient, subsequently developed diarrhea.  The other travellers' symptoms resolved while the patient has continued to have loose stools.  Two days ago he had chills, now resolved. He has had abdominal bloating and cramping, but no pain.  No nausea/vomiting.  No hematochezia.  Patient is a 55 y.o. male presenting with diarrhea. The history is provided by the patient.  Diarrhea Quality:  Watery Severity:  Moderate Onset quality:  Sudden Duration:  4 days Timing:  Intermittent Progression:  Unchanged Relieved by:  Nothing Exacerbated by: eating. Ineffective treatments:  None tried Associated symptoms: abdominal pain and chills   Associated symptoms: no arthralgias, no recent cough, no diaphoresis, no fever, no headaches, no myalgias, no URI and no vomiting   Risk factors: suspect food intake   Risk factors: no recent antibiotic use and no sick contacts     Past Medical History  Diagnosis Date  . Afib ([facility])   . Hypertension   . High cholesterol    Past Surgical History  Procedure Laterality Date  . Hernia repair  1979    left  . Eye surgery      lasik 2001  . Inguinal hernia repair Right 06/05/2014    Procedure: OPEN RIGHT INGUINAL HERNIA REPAIR;  Surgeon: Ralene Ok, MD;  Location: [facility];  Service: General;  Laterality: Right;  . Insertion of mesh Right 06/05/2014    Procedure: INSERTION OF MESH;  Surgeon: Ralene Ok, MD;  Location: [facility];  Service: General;  Laterality: Right;   Family History  Problem Relation Age of Onset  . COPD Mother   . COPD Father    Social  History  Substance Use Topics  . Smoking status: Never Smoker   . Smokeless tobacco: Never Used  . Alcohol Use: 6.0 oz/week    10 Glasses of wine per week    Review of Systems  Constitutional: Positive for chills. Negative for fever and diaphoresis.  Gastrointestinal: Positive for abdominal pain and diarrhea. Negative for vomiting.  Musculoskeletal: Negative for myalgias and arthralgias.  Neurological: Negative for headaches.  All other systems reviewed and are negative.   Allergies  Codeine  Home Medications   Prior to Admission medications   Medication Sig Start Date End Date Taking? Authorizing Provider  diltiazem (DILACOR XR) 120 MG 24 hr capsule Take 1 capsule (120 mg total) by mouth daily. 12/11/15  Yes Eulas Post, MD  fish oil-omega-3 fatty acids 1000 MG capsule Take 2 g by mouth daily.   Yes Historical Provider, MD  fluticasone (FLONASE) 50 MCG/ACT nasal spray PLACE 2 SPRAYS INTO THE NOSE DAILY. 06/18/15  Yes Eulas Post, MD  MULTIPLE VITAMIN PO Take 1 tablet by mouth daily.    Yes Historical Provider, MD  rosuvastatin (CRESTOR) 10 MG tablet Take 1 tablet (10 mg total) by mouth once a week. 02/01/15  Yes Eulas Post, MD  ciprofloxacin (CIPRO) 500 MG tablet Take 1 tablet (500 mg total) by mouth 2 (two) times daily. 12/28/15   Kandra Nicolas, MD  Meds Ordered and Administered this Visit  Medications - No data to display  BP 119/78 mmHg  Pulse 93  Temp(Src) 99 F (37.2 C) (Oral)  Resp 14  Wt 190 lb (86.183 kg)  SpO2 99% No data found.   Physical Exam Nursing notes and Vital Signs reviewed. Appearance:  Patient appears stated age, and in no acute distress Eyes:  Pupils are equal, round, and reactive to light and accomodation.  Extraocular movement is intact.  Conjunctivae are not inflamed  Nose:  Normal Pharynx:  Normal; moist mucous membranes  Neck:  Supple. No adenopathy Lungs:  Clear to auscultation.  Breath sounds are equal.  Moving air  well. Heart:  Regular rate and rhythm without murmurs, rubs, or gallops.  Abdomen:  Nontender without masses or hepatosplenomegaly.  Bowel sounds are present and increased.  No CVA or flank tenderness.  Extremities:  No edema.  Skin:  No rash present.   ED Course  Procedures none  MDM   1. Diarrhea of presumed infectious origin:  "Traveller's Diarrhea"    Begin Cipro 500mg  BID for five days Begin clear liquids (Pedialyte while having diarrhea) until improved, then advance to a [employer] (Bananas, Rice, Applesauce, Toast).  Then gradually resume a regular diet when tolerated.  Avoid milk products until well.  When stools become more formed, may take Imodium (loperamide) once or twice daily to decrease stool frequency.  If symptoms become significantly worse during the night or over the weekend, proceed to the local emergency room.     Kandra Nicolas, MD 12/31/15 2226

## 2015-12-28 NOTE — ED Notes (Signed)
Pt returned from [place] 4 days ago. The next day he developed diarrhea and abdominal cramping. Afberile. Reports colleagues who traveled had similar symptoms but were asymptomatic in 24 hours.

## 2015-12-28 NOTE — Telephone Encounter (Signed)
See other message

## 2016-01-09 ENCOUNTER — Encounter (HOSPITAL_COMMUNITY): Payer: Self-pay | Admitting: Nurse Practitioner

## 2016-01-09 ENCOUNTER — Ambulatory Visit (HOSPITAL_COMMUNITY)
Admission: RE | Admit: 2016-01-09 | Discharge: 2016-01-09 | Disposition: A | Discharge status: Home / Self Care | Payer: 59 | Source: Ambulatory Visit | Attending: Nurse Practitioner | Admitting: Nurse Practitioner

## 2016-01-09 VITALS — BP 132/84 | HR 66

## 2016-01-09 DIAGNOSIS — I480 Paroxysmal atrial fibrillation: Secondary | ICD-10-CM

## 2016-01-09 HISTORY — DX: Paroxysmal atrial fibrillation: I48.0

## 2016-01-09 HISTORY — DX: Bifascicular block: I45.2

## 2016-01-09 MED ORDER — DILTIAZEM HCL 30 MG PO TABS
ORAL_TABLET | ORAL | Status: DC
Start: 2016-01-09 — End: 2017-01-19

## 2016-01-09 MED FILL — dilTIAZem HCL 30 MG TABS: 30 | 4 days supply | Qty: 45 | Fill #0

## 2016-01-10 ENCOUNTER — Encounter (HOSPITAL_COMMUNITY): Payer: Self-pay | Admitting: Nurse Practitioner

## 2016-01-10 DIAGNOSIS — I480 Paroxysmal atrial fibrillation: Secondary | ICD-10-CM | POA: Insufficient documentation

## 2016-01-10 NOTE — Consult Note (Signed)
Atrial Fibrillation Clinic Note   Date:  01/09/16  ID:  Tony Silva, DOB 1961/02/11, MRN [id number]  PCP:  Eulas Post, MD  Cardiologist:  Dr Marlou Porch Primary Electrophysiologist: Thompson Grayer, MD    CC: afib   History of Present Illness: Tony Silva is a 55 y.o. male who presents today for electrophysiology evaluation.   The patient reports initially being diagnosed with atrial fibrillation 02/14/2011 after presenting with tachypalpitations.  He was evaluated by Dr Marlou Porch and placed on diltiazem.  He did well initially but has since had increasing frequency and duration of atrial fibrillation.  He reports symptoms of palpitations and fatigue with his atrial fibrillation. Episodes occur every month or so presently and may last several hours.  He finds that triggers include stress, alcohol, and often immediately upon cessation of exercise.  He tracks his afib with an Administrator, sports.  More recently, he has noticed that his QRS has widened.  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   He denies exertional symptoms.  He is very active and works out on the eliptical 3 times per week without any recent decline in capacity.  Today, he denies symptoms of chest pain, shortness of breath, orthopnea, PND, lower extremity edema, claudication, dizziness, presyncope, syncope, bleeding, or neurologic sequela. The patient is tolerating medications without difficulties and is otherwise without complaint today.    Past Medical History  Diagnosis Date  . Paroxysmal atrial fibrillation ([facility])   . Hypertension   . High cholesterol    Past Surgical History  Procedure Laterality Date  . Hernia repair  1979    left  . Eye surgery      lasik 2001  . Inguinal hernia repair Right 06/05/2014    Procedure: OPEN RIGHT INGUINAL HERNIA REPAIR;  Surgeon: Ralene Ok, MD;  Location: [facility];  Service:  General;  Laterality: Right;  . Insertion of mesh Right 06/05/2014    Procedure: INSERTION OF MESH;  Surgeon: Ralene Ok, MD;  Location: [facility];  Service: General;  Laterality: Right;     Current Outpatient Prescriptions  Medication Sig Dispense Refill  . diltiazem (CARDIZEM) 30 MG tablet Cardizem 30mg  -- take 1-2 tablets every 4 hours AS NEEDED for heart rate >100 as long as blood pressure >100. 45 tablet 3  . diltiazem (DILACOR XR) 120 MG 24 hr capsule Take 1 capsule (120 mg total) by mouth daily. 90 capsule 3  . fish oil-omega-3 fatty acids 1000 MG capsule Take 2 g by mouth daily.    . fluticasone (FLONASE) 50 MCG/ACT nasal spray PLACE 2 SPRAYS INTO THE NOSE DAILY. 48 g 3  . MULTIPLE VITAMIN PO Take 1 tablet by mouth daily.     . rosuvastatin (CRESTOR) 10 MG tablet Take 1 tablet (10 mg total) by mouth once a week. 30 tablet 1   No current facility-administered medications for this encounter.    Allergies:   Codeine   Social History:  The patient  reports that he has never smoked. He has never used smokeless tobacco. He reports that he drinks about 6.0 oz of alcohol per week. He reports that he does not use illicit drugs.   Family History:  The patient's  family history includes COPD in his father and mother.    ROS:  Please see the history of present illness.   All other systems are reviewed and negative.  PHYSICAL EXAM: VS:  BP 132/84 mmHg  Pulse 66 , BMI There is no weight on file to calculate BMI. GEN: Well nourished, well developed, in no acute distress HEENT: normal Neck: no JVD, carotid bruits, or masses Cardiac: RRR; no murmurs, rubs, or gallops,no edema  Respiratory:  clear to auscultation bilaterally, normal work of breathing GI: soft, nontender, nondistended, + BS MS: no deformity or atrophy Skin: warm and dry  Neuro:  Strength and sensation are intact Psych: euthymic mood, full affect  He brings an ekg with him today which reveals sinus rhythm with first  degree AV block and RBBB  Recent Labs: 02/01/2015: ALT 34; BUN 15; Creatinine, Ser 0.85; Hemoglobin 16.0; Platelets 267.0; Potassium 4.0; Sodium 140; TSH 2.22    Lipid Panel     Component Value Date/Time   CHOL 219* 02/01/2015 0935   TRIG 116.0 02/01/2015 0935   HDL 64.80 02/01/2015 0935   CHOLHDL 3 02/01/2015 0935   VLDL 23.2 02/01/2015 0935   LDLCALC 131* 02/01/2015 0935     Wt Readings from Last 3 Encounters:  12/28/15 190 lb (86.183 kg)  12/11/15 192 lb (87.091 kg)  02/01/15 196 lb (88.905 kg)      Other studies Reviewed: Additional studies/ records that were reviewed today include: prior ekgs, prior echo  Review of the above records today demonstrates: preserved EF without valvular disease, chronic first degree AV block on ekg with incomplete rbb previously noted   ASSESSMENT AND PLAN:  1.  Paroxysmal atrial fibrillation The patient has symptomatic paroxysmal atrial fibrillation of increasing burden.  He has not tried AAD therapy.  He finds cardizem somewhat helpful.  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  He denies h/o rheumatic heart disease.  Chads2vasc score is 0.  He therefore does not have indication for anticoagulation at this time. Therapeutic strategies for afib including medicine and ablation were discussed in detail with the patient today.  At this time, we will add short activing cardizem 30mg  which he can take prn with afib.   If needed, we could consider low dose flecainide (100mg  prn) as a pill in pocket or low dose flecainide (50mg  bid) for maintenance therapy though given recent advances in RBBB, I am reluctant to do so.  We discussed multaq as an alternative.  If his afib progresses to this point, we will likely have a low threshold to consider ablation therapy.  Preventive measures including stress reduction, ongoing exercise, and reduced ETOH were discussed at length today.  His BP is marginal though not fully elevated.  We could consider  addition of an ARB in order to promote positive atrial remodeling and to prevent atrial fibrosis in the future.  I will obtain an echo to evaluate for structural changes related to atrial fibrillation, particularly in the setting of new RBBB. He has no symptoms of ischemia and thus my suspicion for CAD is low at this time.  Follow-up:  He will follow with me as needed going forward.  Labs/ tests ordered today include:  Orders Placed This Encounter  Procedures  . ECHOCARDIOGRAM COMPLETE     Signed, Thompson Grayer, MD    [place] [address] [phone] (office) [phone] (fax)

## 2016-01-11 ENCOUNTER — Ambulatory Visit (HOSPITAL_COMMUNITY)
Admission: RE | Admit: 2016-01-11 | Discharge: 2016-01-11 | Disposition: A | Discharge status: Home / Self Care | Payer: 59 | Source: Ambulatory Visit | Attending: Internal Medicine | Admitting: Internal Medicine

## 2016-01-11 DIAGNOSIS — I4510 Unspecified right bundle-branch block: Secondary | ICD-10-CM | POA: Diagnosis not present

## 2016-01-11 DIAGNOSIS — I440 Atrioventricular block, first degree: Secondary | ICD-10-CM | POA: Insufficient documentation

## 2016-01-11 DIAGNOSIS — I480 Paroxysmal atrial fibrillation: Secondary | ICD-10-CM | POA: Diagnosis not present

## 2016-01-11 NOTE — Progress Notes (Signed)
  Echocardiogram 2D Echocardiogram has been performed.  Donata Clay 01/11/2016, 12:53 PM

## 2016-02-19 ENCOUNTER — Encounter: Payer: Self-pay | Admitting: Family Medicine

## 2016-02-19 DIAGNOSIS — K625 Hemorrhage of anus and rectum: Secondary | ICD-10-CM

## 2016-02-21 ENCOUNTER — Ambulatory Visit (INDEPENDENT_AMBULATORY_CARE_PROVIDER_SITE_OTHER): Payer: 59 | Admitting: Gastroenterology

## 2016-02-21 ENCOUNTER — Encounter: Payer: Self-pay | Admitting: Gastroenterology

## 2016-02-21 VITALS — BP 120/74 | HR 72 | Ht 72.0 in | Wt 194.6 lb

## 2016-02-21 DIAGNOSIS — K648 Other hemorrhoids: Secondary | ICD-10-CM

## 2016-02-21 DIAGNOSIS — K625 Hemorrhage of anus and rectum: Secondary | ICD-10-CM

## 2016-02-21 NOTE — Progress Notes (Signed)
HPI :  55 y/o male seen today in consultation for Dr. Carolann Littler forfor symptoms of rectal bleeding. He has a history of paroxysmal AF and HLD.  He endorses intermittent symptoms of rectal bleeding for a few months or so. He sees bright red blood in both the toilet and on the paper with wipes. He thinks he sees it with most bowel movements. Symptoms have been worsening over the last 2 weeks. The bleeding is painless. He has a few bowel movements per day. No hard stools or straining. No diarrhea. No abdominal pains. No weight loss. No vomiting, eating well. He is not on any medical therapy. His last colonoscopy was around 2008 and thought to have been normal. No known FH of colon cancer.   Endoscopic history: Colonoscopy 2008 time frame - told to be normal Colonoscopy 2003 - small polyp removed    Past Medical History  Diagnosis Date  . Paroxysmal atrial fibrillation (HCC)   . High cholesterol   . RBBB (right bundle branch block with left anterior fascicular block)      Past Surgical History  Procedure Laterality Date  . Hernia repair  1979    left  . Eye surgery      lasik 2001  . Inguinal hernia repair Right 06/05/2014    Procedure: OPEN RIGHT INGUINAL HERNIA REPAIR;  Surgeon: Ralene Ok, MD;  Location: [facility];  Service: General;  Laterality: Right;  . Insertion of mesh Right 06/05/2014    Procedure: INSERTION OF MESH;  Surgeon: Ralene Ok, MD;  Location: [facility];  Service: General;  Laterality: Right;   Family History  Problem Relation Age of Onset  . COPD Mother   . COPD Father   . Esophageal cancer Neg Hx   . Colon cancer Neg Hx   . Stomach cancer Neg Hx    Social History  Substance Use Topics  . Smoking status: Never Smoker   . Smokeless tobacco: Never Used  . Alcohol Use: Yes     Comment: 6-8 glasses per week   Current Outpatient Prescriptions  Medication Sig Dispense Refill  . diltiazem (CARDIZEM) 30 MG tablet Cardizem 30mg  -- take 1-2 tablets every  4 hours AS NEEDED for heart rate >100 as long as blood pressure >100. 45 tablet 3  . diltiazem (DILACOR XR) 120 MG 24 hr capsule Take 1 capsule (120 mg total) by mouth daily. 90 capsule 3  . fish oil-omega-3 fatty acids 1000 MG capsule Take 2 g by mouth daily.    . fluticasone (FLONASE) 50 MCG/ACT nasal spray PLACE 2 SPRAYS INTO THE NOSE DAILY. 48 g 3  . MULTIPLE VITAMIN PO Take 1 tablet by mouth daily.     . rosuvastatin (CRESTOR) 10 MG tablet Take 1 tablet (10 mg total) by mouth once a week. 30 tablet 1   No current facility-administered medications for this visit.   Allergies  Allergen Reactions  . Codeine Nausea Only     Review of Systems: All systems reviewed and negative except where noted in HPI.   Lab Results  Component Value Date   WBC 4.2 02/01/2015   HGB 16.0 02/01/2015   HCT 47.1 02/01/2015   MCV 94.5 02/01/2015   PLT 267.0 02/01/2015     Physical Exam: BP 120/74 mmHg  Pulse 72  Ht 6' (1.829 m)  Wt 194 lb 9.6 oz (88.27 kg)  BMI 26.39 kg/m2 Constitutional: Pleasant,well-developed, male in no acute distress. HEENT: Normocephalic and atraumatic. Conjunctivae are normal. No scleral  icterus. Neck supple.  Cardiovascular: Normal rate, regular rhythm.  Pulmonary/chest: Effort normal and breath sounds normal. No wheezing, rales or rhonchi. Abdominal: Soft, nondistended, nontender. Bowel sounds active throughout. There are no masses palpable. No hepatomegaly. DRE / Anoscopy: no fissure or external hemorrhoids, internal hemorrhoids noted in all positions, largest in RP area Extremities: no edema Lymphadenopathy: No cervical adenopathy noted. Neurological: Alert and oriented to person place and time. Skin: Skin is warm and dry. No rashes noted. Psychiatric: Normal mood and affect. Behavior is normal.   ASSESSMENT AND PLAN: 55 y/o male with medical history as above, presenting with bright red blood per rectum for several weeks, worsening over time. While anoscopy  shows hemorrhoids as the most likely etiology, his last colonoscopy was 9 years ago and recommend a colonoscopy to ensure no bleeding polyp or mass lesion given he is due for screening in the upcoming months anyway. I discussed risks / benefits of colonoscopy with him and he wished to proceed.  Otherwise we discussed treatment options for hemorrhoids to include fiber supplements, banding, and surgical options. Following a discussion of options he wished to proceed with banding, and RP hemorrhoid banded today (procedure note below). I recommend he take a fiber supplement daily at this time, and we will plan for another banding in 2-3 weeks. Counseled that 3 bands total are recommended for optimal response. He agreed with the plan.   [place] Cellar, MD [facility] Pager [phone]  CC: Eulas Post, MD       PROCEDURE NOTE: The patient presents with symptomatic grade I hemorrhoids, requesting rubber band ligation of his/her hemorrhoidal disease.  All risks, benefits and alternative forms of therapy were described and informed consent was obtained.  In the Left Lateral Decubitus position anoscopic examination revealed grade I hemorrhoids in all position(s).  The anorectum was pre-medicated with 0.125% nitroglycerin The decision was made to band the RP internal hemorrhoid, and the [place] was used to perform band ligation without complication.  Digital anorectal examination was then performed to assure proper positioning of the band, and to adjust the banded tissue as required.  The patient was discharged home without pain or other issues.  Dietary and behavioral recommendations were given and along with follow-up instructions.     The following adjunctive treatments were recommended: Daily fiber supplement  The patient will return in 2-3 weeks for  follow-up and possible additional banding as required. No complications were encountered and the patient  tolerated the procedure well.

## 2016-02-21 NOTE — Patient Instructions (Signed)
You have been scheduled for your 2nd hemorrhoid banding on 03/14/2016 at 3:15pm   I will prepare your colonoscopy instructions after you have called to schedule it -ask to speak to Magda Paganini or [place]   1. The procedure you have had should have been relatively painless since the banding of the area involved does not have nerve endings and there is no pain sensation.  The rubber band cuts off the blood supply to the hemorrhoid and the band may fall off as soon as 48 hours after the banding (the band may occasionally be seen in the toilet bowl following a bowel movement). You may notice a temporary feeling of fullness in the rectum which should respond adequately to plain Tylenol or Motrin.  2. Following the banding, avoid strenuous exercise that evening and resume full activity the next day.  A sitz bath (soaking in a warm tub) or bidet is soothing, and can be useful for cleansing the area after bowel movements.     3. To avoid constipation, take two tablespoons of natural wheat bran, natural oat bran, flax, Benefiber or any over the counter fiber supplement and increase your water intake to 7-8 glasses daily.    4. Unless you have been prescribed anorectal medication, do not put anything inside your rectum for two weeks: No suppositories, enemas, fingers, etc.  5. Occasionally, you may have more bleeding than usual after the banding procedure.  This is often from the untreated hemorrhoids rather than the treated one.  Don't be concerned if there is a tablespoon or so of blood.  If there is more blood than this, lie flat with your bottom higher than your head and apply an ice pack to the area. If the bleeding does not stop within a half an hour or if you feel faint, call our office at [phone] or go to the emergency room.  6. Problems are not common; however, if there is a substantial amount of bleeding, severe pain, chills, fever or  difficulty passing urine (very rare) or other problems, you should call us at [phone] or report to the nearest emergency room.  7. Do not stay seated continuously for more than 2-3 hours for a day or two after the procedure.  Tighten your buttock muscles 10-15 times every two hours and take 10-15 deep breaths every 1-2 hours.  Do not spend more than a few minutes on the toilet if you cannot empty your bowel; instead re-visit the toilet at a later time.

## 2016-03-11 ENCOUNTER — Other Ambulatory Visit: Payer: Self-pay | Admitting: Family Medicine

## 2016-03-11 MED FILL — ROSUVASTATIN CALCIUM 10 MG: 10 | 84 days supply | Qty: 12 | Fill #0

## 2016-03-11 MED FILL — CARTIA XT 120 MG CAPSULE SA: 120 | 90 days supply | Qty: 90 | Fill #1

## 2016-03-14 ENCOUNTER — Encounter: Payer: Self-pay | Admitting: Gastroenterology

## 2016-03-14 ENCOUNTER — Ambulatory Visit (INDEPENDENT_AMBULATORY_CARE_PROVIDER_SITE_OTHER): Payer: 59 | Admitting: Gastroenterology

## 2016-03-14 VITALS — BP 120/64 | HR 68 | Ht 72.0 in | Wt 195.0 lb

## 2016-03-14 DIAGNOSIS — K649 Unspecified hemorrhoids: Secondary | ICD-10-CM

## 2016-03-14 NOTE — Patient Instructions (Signed)

## 2016-03-14 NOTE — Progress Notes (Signed)
PROCEDURE NOTE: The patient presents with symptomatic grade I  hemorrhoids, requesting rubber band ligation of his/her hemorrhoidal disease.  All risks, benefits and alternative forms of therapy were described and informed consent was obtained.   The anorectum was pre-medicated with 0.125% nitrglycerin The decision was made to band the LL internal hemorrhoid, and the [place] was used to perform band ligation without complication.  Digital anorectal examination was then performed to assure proper positioning of the band, and to adjust the banded tissue as required.  The patient was discharged home without pain or other issues.  Dietary and behavioral recommendations were given and along with follow-up instructions.     The following adjunctive treatments were recommended: Daily fiber supplement  The patient will return in 2-4 weeks for  follow-up and possible additional banding as required. No complications were encountered and the patient tolerated the procedure well.  [place] Cellar, MD [facility] Pager [phone]

## 2016-03-28 ENCOUNTER — Ambulatory Visit (INDEPENDENT_AMBULATORY_CARE_PROVIDER_SITE_OTHER): Payer: 59 | Admitting: Gastroenterology

## 2016-03-28 ENCOUNTER — Encounter: Payer: Self-pay | Admitting: Gastroenterology

## 2016-03-28 VITALS — BP 110/80 | HR 64 | Ht 71.0 in | Wt 195.5 lb

## 2016-03-28 DIAGNOSIS — K648 Other hemorrhoids: Secondary | ICD-10-CM | POA: Diagnosis not present

## 2016-03-28 NOTE — Patient Instructions (Signed)

## 2016-03-28 NOTE — Progress Notes (Signed)
PROCEDURE NOTE: The patient presents with symptomatic grade I  hemorrhoids, requesting rubber band ligation of his/her hemorrhoidal disease.  All risks, benefits and alternative forms of therapy were described and informed consent was obtained.   The anorectum was pre-medicated with 0.125% nitroglycerin The decision was made to band the RA internal hemorrhoid, and the [place] was used to perform band ligation without complication.  Digital anorectal examination was then performed to assure proper positioning of the band, and to adjust the banded tissue as required.  The patient was discharged home without pain or other issues.  Dietary and behavioral recommendations were given and along with follow-up instructions.     The following adjunctive treatments were recommended: Daily fiber supplement  The patient will return as needed for  follow-up, he has completed 3 bandings No complications were encountered and the patient tolerated the procedure well.  [place] Cellar, MD [facility] Pager [phone]

## 2016-04-01 IMAGING — CR DG CHEST 2V
2 series · 2 of 2 positions shown · non-contrast
Comparison: 02/12/2011

CLINICAL DATA: Right inguinal hernia repair.

EXAM:
CHEST  2 VIEW

[w chest pa]
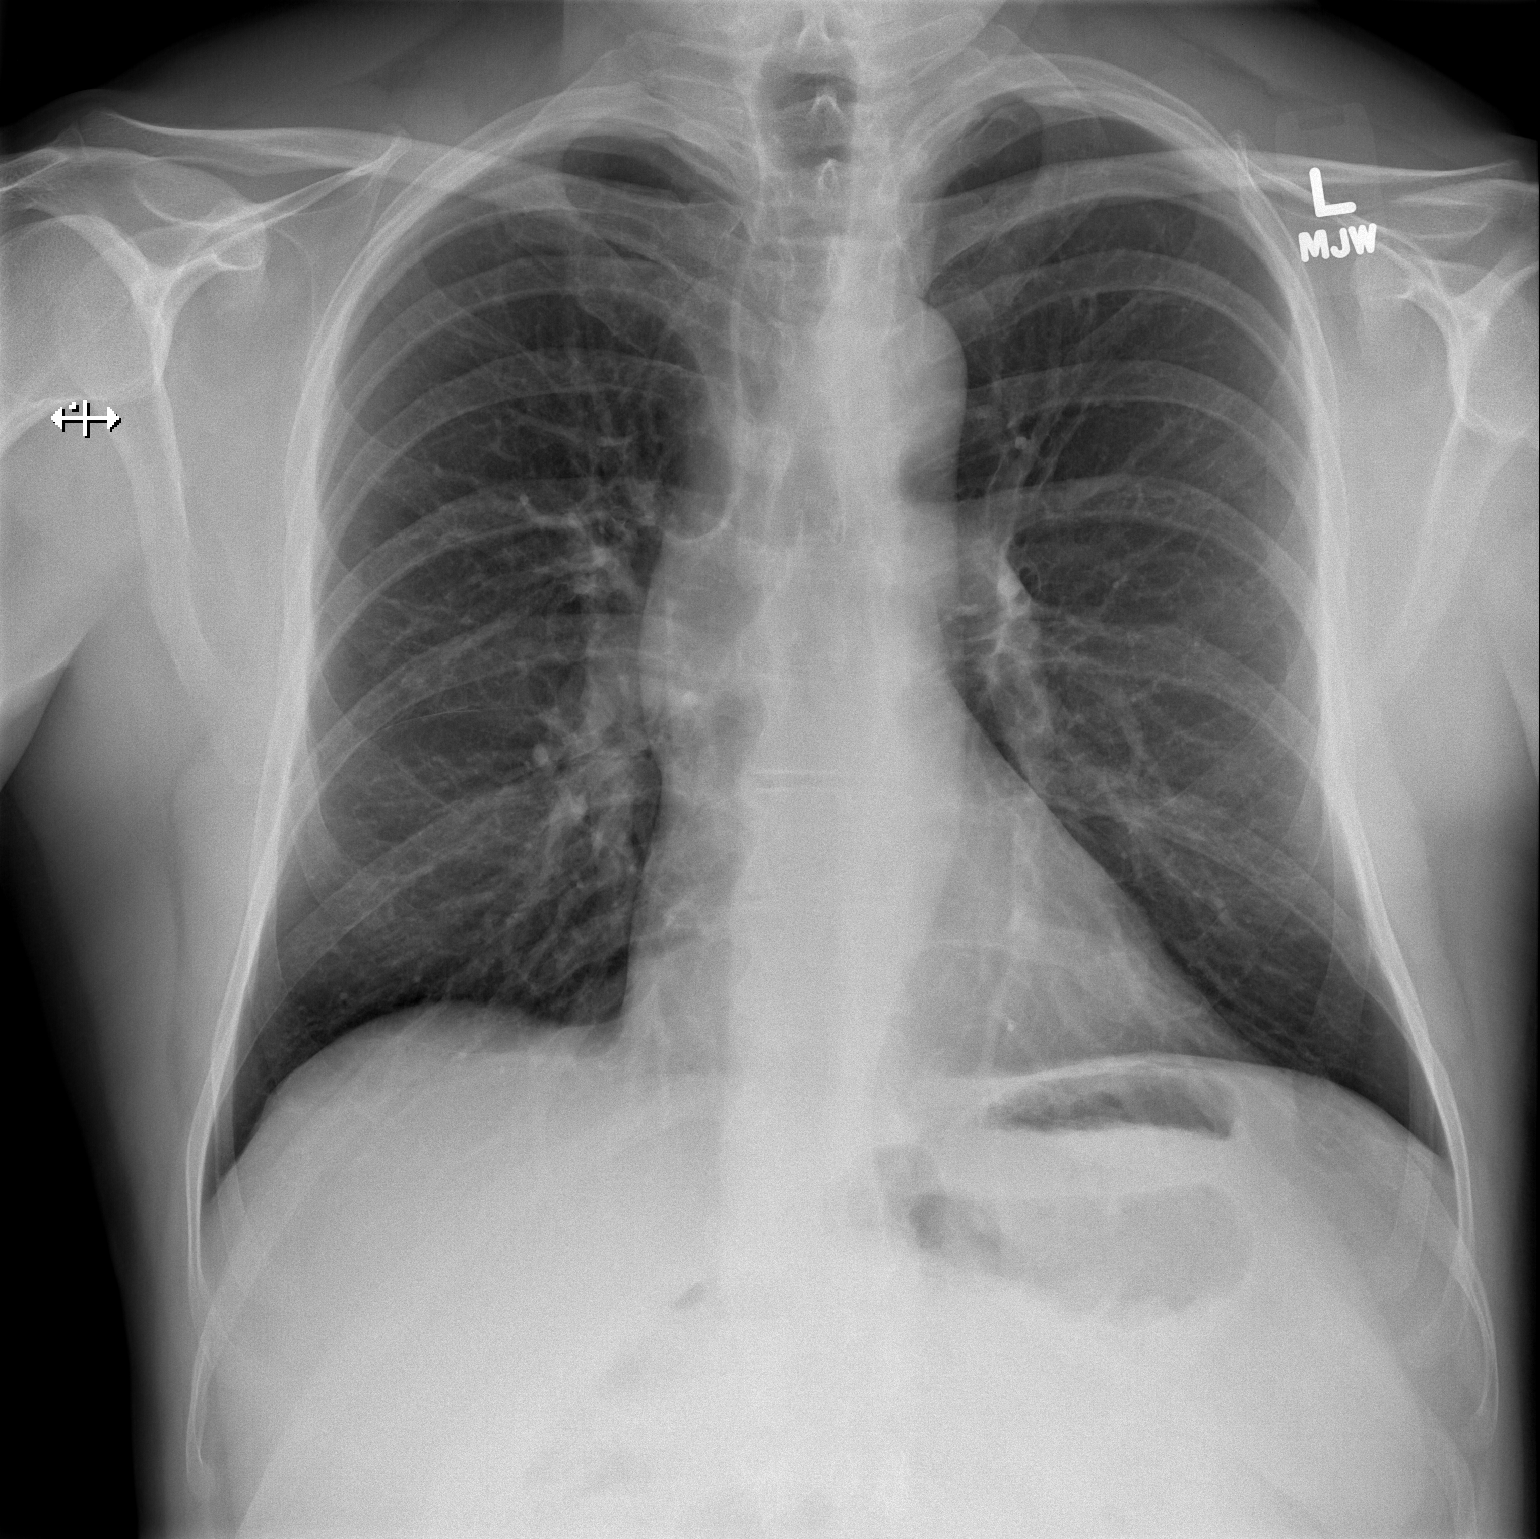

[w chest lat]
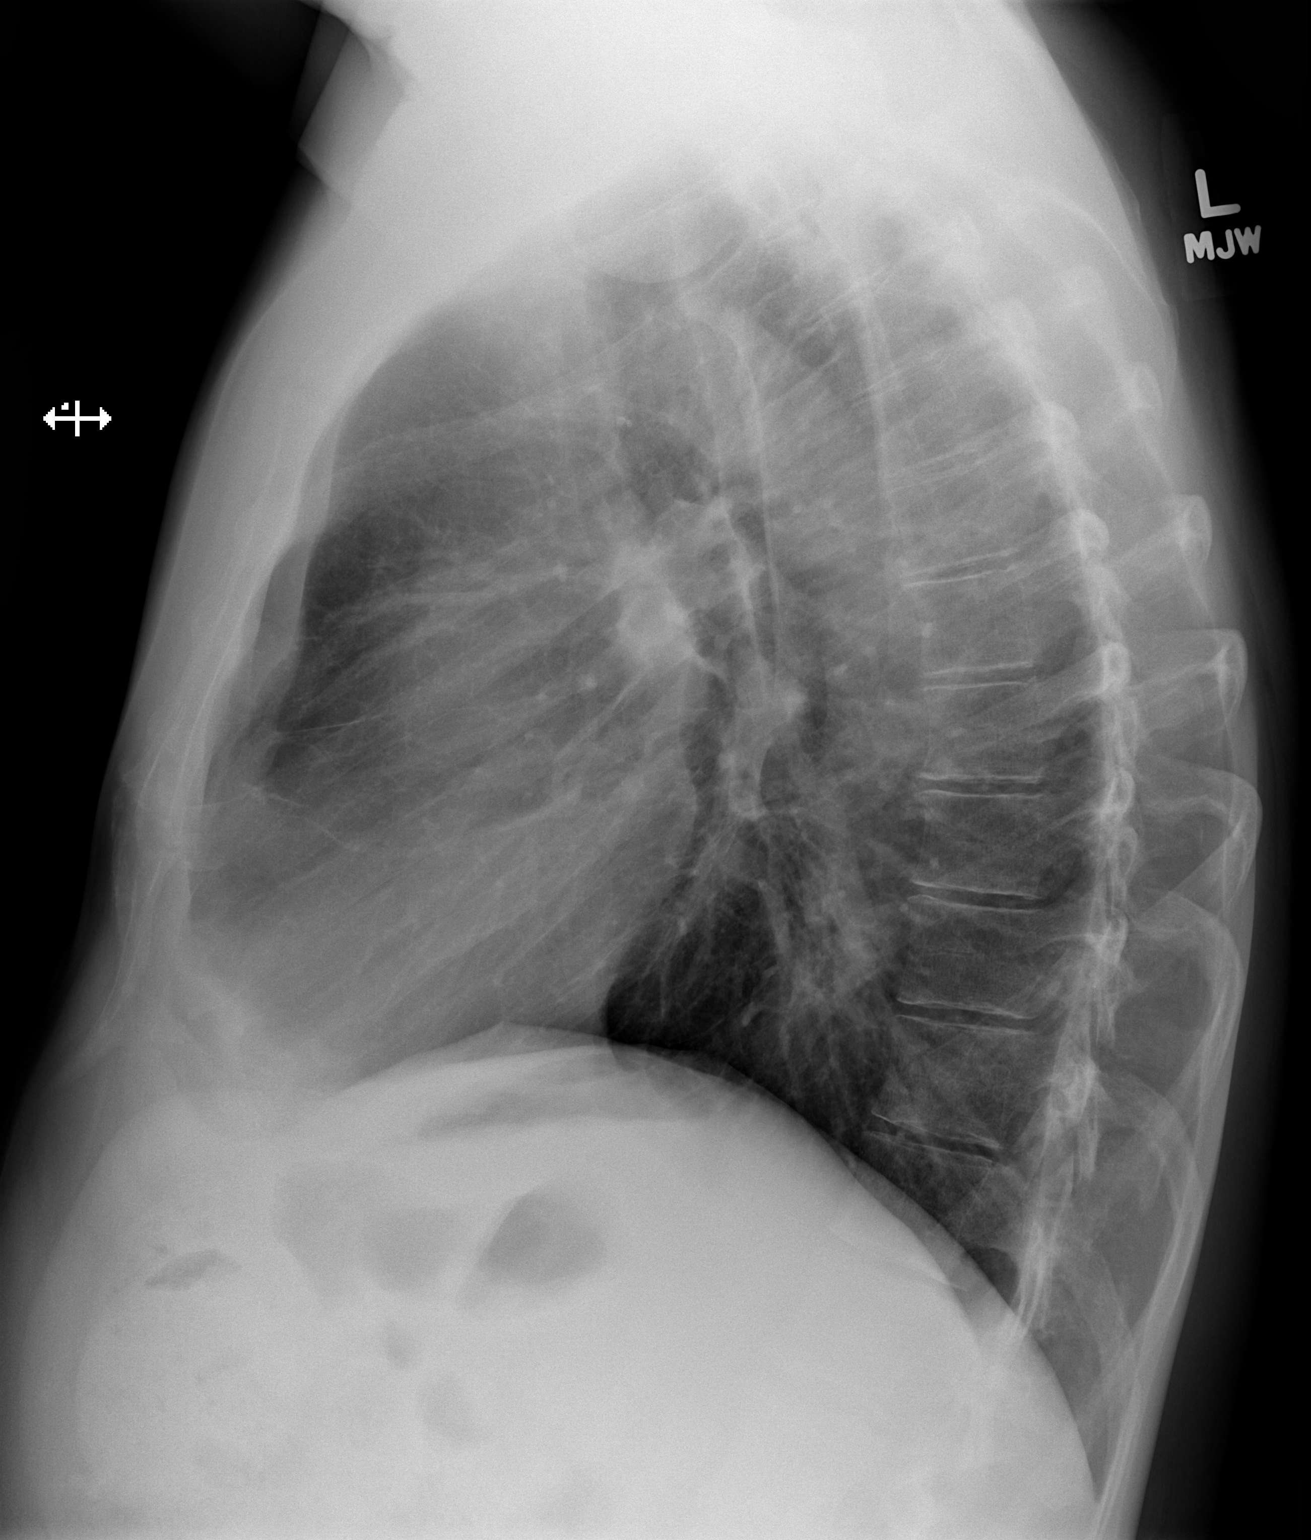

[2 of 2 positions shown; findings below may reference images not displayed]

FINDINGS: The heart size and mediastinal contours are within normal limits.
Both lungs are clear. The visualized skeletal structures are
unremarkable.
IMPRESSION: No active cardiopulmonary disease.

## 2016-06-03 MED FILL — ROSUVASTATIN CALCIUM 10 MG: 10 | 84 days supply | Qty: 12 | Fill #1

## 2016-06-03 MED FILL — CARTIA XT 120 MG CAPSULE SA: 120 | 90 days supply | Qty: 90 | Fill #2

## 2016-06-03 MED FILL — FLUTICASONE PROP 50 MCG SPR: 50 | 90 days supply | Qty: 48 | Fill #2

## 2016-07-10 MED FILL — dilTIAZem HCL 30 MG TABS: 30 | 4 days supply | Qty: 45 | Fill #1

## 2016-08-14 ENCOUNTER — Telehealth: Payer: Self-pay | Admitting: Gastroenterology

## 2016-08-14 NOTE — Telephone Encounter (Signed)
Looked at Dr. Doyne Keel 02/21/16 note, patient scheduled for colonoscopy to verify no bleeding polyp or mass lesion.

## 2016-08-29 ENCOUNTER — Ambulatory Visit (AMBULATORY_SURGERY_CENTER): Payer: Self-pay

## 2016-08-29 VITALS — Ht 72.0 in | Wt 195.8 lb

## 2016-08-29 DIAGNOSIS — Z86010 Personal history of colonic polyps: Secondary | ICD-10-CM

## 2016-08-29 MED ORDER — NA SULFATE-K SULFATE-MG SULF 17.5-3.13-1.6 GM/177ML PO SOLN: ORAL | 0 refills | Status: DC

## 2016-08-29 NOTE — Progress Notes (Signed)
Per pt, no allergies to soy or egg products.Pt not taking any weight loss meds or using  O2 at home. 

## 2016-09-01 MED FILL — SUPREP BOWEL PREP KIT: 17.5-3.13-1 | 1 days supply | Qty: 354 | Fill #0

## 2016-09-02 MED FILL — ROSUVASTATIN CALCIUM 10 MG: 10 | 84 days supply | Qty: 12 | Fill #2

## 2016-09-02 MED FILL — CARTIA XT 120 MG CAPSULE SA: 120 | 90 days supply | Qty: 90 | Fill #3

## 2016-09-05 ENCOUNTER — Encounter: Payer: Self-pay | Admitting: Gastroenterology

## 2016-09-05 ENCOUNTER — Ambulatory Visit (AMBULATORY_SURGERY_CENTER): Payer: 59 | Admitting: Gastroenterology

## 2016-09-05 VITALS — BP 111/71 | HR 58 | Temp 97.1°F | Resp 14 | Ht 72.0 in | Wt 195.0 lb

## 2016-09-05 DIAGNOSIS — D128 Benign neoplasm of rectum: Secondary | ICD-10-CM | POA: Diagnosis not present

## 2016-09-05 DIAGNOSIS — D129 Benign neoplasm of anus and anal canal: Secondary | ICD-10-CM

## 2016-09-05 DIAGNOSIS — K921 Melena: Secondary | ICD-10-CM | POA: Diagnosis not present

## 2016-09-05 DIAGNOSIS — D123 Benign neoplasm of transverse colon: Secondary | ICD-10-CM | POA: Diagnosis not present

## 2016-09-05 DIAGNOSIS — D124 Benign neoplasm of descending colon: Secondary | ICD-10-CM

## 2016-09-05 DIAGNOSIS — Z1211 Encounter for screening for malignant neoplasm of colon: Secondary | ICD-10-CM | POA: Diagnosis not present

## 2016-09-05 DIAGNOSIS — Z86010 Personal history of colonic polyps: Secondary | ICD-10-CM

## 2016-09-05 MED ORDER — SODIUM CHLORIDE 0.9 % IV SOLN: 500.0000 mL | INTRAVENOUS | Status: AC

## 2016-09-05 NOTE — Progress Notes (Signed)
Called to room to assist during endoscopic procedure.  Patient ID and intended procedure confirmed with present staff. Received instructions for my participation in the procedure from the performing physician.  

## 2016-09-05 NOTE — Progress Notes (Signed)
Right arm swollen after IV infiltration at anticubital site. Warm compress given.  Pt. Advised to elevate right Arm and OK to apply warm compresses at home.

## 2016-09-05 NOTE — Op Note (Signed)
[facility] Patient Name: Tony Silva Procedure Date: 09/05/2016 3:10 PM MRN: [id number] Endoscopist: Remo Lipps P. Shuan Statzer MD, MD Age: 55 Referring MD:  Date of Birth: [birth year] Gender: Male Account #: [id number] Procedure:                Colonoscopy Indications:              Evaluation of rectal bleeding thought to be due to                            hemorrhoids, last colonoscopy in 2008, colonoscopy                            to ensure no other cause of bleeding Medicines:                Monitored Anesthesia Care Procedure:                Pre-Anesthesia Assessment:                           - Prior to the procedure, a History and Physical                            was performed, and patient medications and                            allergies were reviewed. The patient's tolerance of                            previous anesthesia was also reviewed. The risks                            and benefits of the procedure and the sedation                            options and risks were discussed with the patient.                            All questions were answered, and informed consent                            was obtained. Prior Anticoagulants: The patient has                            taken no previous anticoagulant or antiplatelet                            agents. ASA Grade Assessment: II - A patient with                            mild systemic disease. After reviewing the risks                            and benefits, the patient was deemed in  satisfactory condition to undergo the procedure.                           After obtaining informed consent, the colonoscope                            was passed under direct vision. Throughout the                            procedure, the patient's blood pressure, pulse, and                            oxygen saturations were monitored continuously. The                            Model  CF-HQ190L [id number]) scope was introduced                            through the anus and advanced to the the terminal                            ileum, with identification of the appendiceal                            orifice and IC valve. The colonoscopy was                            technically difficult and complex due to a tortuous                            colon. The patient tolerated the procedure well.                            The quality of the bowel preparation was good. The                            terminal ileum, ileocecal valve, appendiceal                            orifice, and rectum were photographed. Scope In: 3:30:05 PM Scope Out: 4:09:32 PM Scope Withdrawal Time: 0 hours 33 minutes 0 seconds  Total Procedure Duration: 0 hours 39 minutes 27 seconds  Findings:                 The perianal and digital rectal examinations were                            normal.                           A 3 mm polyp was found in the transverse colon. The                            polyp was sessile. The polyp was removed with a  cold biopsy forceps. Resection and retrieval were                            complete.                           A 5 mm polyp was found in the splenic flexure. The                            polyp was sessile. The polyp was removed with a                            cold snare. Resection and retrieval were complete.                           Two sessile polyps were found in the descending                            colon. The polyps were 5 mm in size. These polyps                            were removed with a cold snare. Resection and                            retrieval were complete.                           A 6 mm polyp was found in the rectum. The polyp was                            semi-pedunculated. The polyp was removed with a hot                            snare. Resection and retrieval were complete.                            Non-bleeding internal hemorrhoids were found during                            retroflexion. They were small, scarring from prior                            banding noted.                           The terminal ileum appeared normal.                           The exam was extremely tortous. It otherwise                            without abnormality. Complications:            No immediate complications. Estimated blood loss:  Minimal. Estimated Blood Loss:     Estimated blood loss was minimal. Impression:               - One 3 mm polyp in the transverse colon, removed                            with a cold biopsy forceps. Resected and retrieved.                           - One 5 mm polyp at the splenic flexure, removed                            with a cold snare. Resected and retrieved.                           - Two 5 mm polyps in the descending colon, removed                            with a cold snare. Resected and retrieved.                           - One 6 mm polyp in the rectum, removed with a hot                            snare. Resected and retrieved.                           - Non-bleeding internal hemorrhoids with evidence                            of scarring from prior banding.                           - The examined portion of the ileum was normal.                           - Tortous colon                           - The examination was otherwise normal. Recommendation:           - Patient has a contact number available for                            emergencies. The signs and symptoms of potential                            delayed complications were discussed with the                            patient. Return to normal activities tomorrow.                            Written discharge instructions were provided to the  patient.                           - Resume previous diet.                           -  Continue present medications.                           - No ibuprofen, naproxen, or other non-steroidal                            anti-inflammatory drugs for 2 weeks after polyp                            removal.                           - Await pathology results.                           - Repeat colonoscopy is recommended for                            surveillance. The colonoscopy date will be                            determined after pathology results from today's                            exam become available for review. Remo Lipps P. Johnna Bollier MD, MD 09/05/2016 4:17:42 PM This report has been signed electronically.

## 2016-09-05 NOTE — Progress Notes (Signed)
A and O x3. Report to RN. Tolerated MAC anesthesia well. 

## 2016-09-05 NOTE — Patient Instructions (Signed)
YOU HAD AN ENDOSCOPIC PROCEDURE TODAY AT [facility]:   Refer to the procedure report that was given to you for any specific questions about what was found during the examination.  If the procedure report does not answer your questions, please call your gastroenterologist to clarify.  If you requested that your care partner not be given the details of your procedure findings, then the procedure report has been included in a sealed envelope for you to review at your convenience later.  YOU SHOULD EXPECT: Some feelings of bloating in the abdomen. Passage of more gas than usual.  Walking can help get rid of the air that was put into your GI tract during the procedure and reduce the bloating. If you had a lower endoscopy (such as a colonoscopy or flexible sigmoidoscopy) you may notice spotting of blood in your stool or on the toilet paper. If you underwent a bowel prep for your procedure, you may not have a normal bowel movement for a few days.  Please Note:  You might notice some irritation and congestion in your nose or some drainage.  This is from the oxygen used during your procedure.  There is no need for concern and it should clear up in a day or so.  SYMPTOMS TO REPORT IMMEDIATELY:   Following lower endoscopy (colonoscopy or flexible sigmoidoscopy):  Excessive amounts of blood in the stool  Significant tenderness or worsening of abdominal pains  Swelling of the abdomen that is new, acute  Fever of 100F or higher   Following upper endoscopy (EGD)  Vomiting of blood or coffee ground material  New chest pain or pain under the shoulder blades  Painful or persistently difficult swallowing  New shortness of breath  Fever of 100F or higher  Black, tarry-looking stools  For urgent or emergent issues, a gastroenterologist can be reached at any hour by calling [phone].   DIET:  We do recommend a small meal at first, but then you may proceed to your regular diet.  Drink  plenty of fluids but you should avoid alcoholic beverages for 24 hours.  ACTIVITY:  You should plan to take it easy for the rest of today and you should NOT DRIVE or use heavy machinery until tomorrow (because of the sedation medicines used during the test).    FOLLOW UP: Our staff will call the number listed on your records the next business day following your procedure to check on you and address any questions or concerns that you may have regarding the information given to you following your procedure. If we do not reach you, we will leave a message.  However, if you are feeling well and you are not experiencing any problems, there is no need to return our call.  We will assume that you have returned to your regular daily activities without incident.  If any biopsies were taken you will be contacted by phone or by letter within the next 1-3 weeks.  Please call us at [phone] if you have not heard about the biopsies in 3 weeks.    SIGNATURES/CONFIDENTIALITY: You and/or your care partner have signed paperwork which will be entered into your electronic medical record.  These signatures attest to the fact that that the information above on your After Visit Summary has been reviewed and is understood.  Full responsibility of the confidentiality of this discharge information lies with you and/or your care-partner.  Polyp and hemorrhoid information given.  No ibuprofen,naproxen, or other non-steroidal  anti-inflammatory medications for 2 weeks.

## 2016-09-08 ENCOUNTER — Telehealth: Payer: Self-pay | Admitting: *Deleted

## 2016-09-08 NOTE — Telephone Encounter (Signed)
  Follow up Call- called [phone]  No flowsheet data found.   Patient questions:  Do you have a fever, pain , or abdominal swelling? No. Pain Score  0 *  Have you tolerated food without any problems? Yes.    Have you been able to return to your normal activities? Yes.    Do you have any questions about your discharge instructions: Diet   No. Medications  No. Follow up visit  No.  Do you have questions or concerns about your Care? No.  Actions: * If pain score is 4 or above: No action needed, pain <4.

## 2016-09-09 ENCOUNTER — Encounter: Payer: Self-pay | Admitting: Gastroenterology

## 2016-10-30 ENCOUNTER — Other Ambulatory Visit: Payer: Self-pay | Admitting: Internal Medicine

## 2016-10-30 ENCOUNTER — Ambulatory Visit (INDEPENDENT_AMBULATORY_CARE_PROVIDER_SITE_OTHER)
Admission: RE | Admit: 2016-10-30 | Discharge: 2016-10-30 | Disposition: A | Discharge status: Home / Self Care | Payer: 59 | Source: Ambulatory Visit | Attending: Internal Medicine | Admitting: Internal Medicine

## 2016-10-30 DIAGNOSIS — R058 Other specified cough: Secondary | ICD-10-CM

## 2016-10-30 DIAGNOSIS — R05 Cough: Secondary | ICD-10-CM

## 2016-12-02 ENCOUNTER — Other Ambulatory Visit: Payer: Self-pay | Admitting: Family Medicine

## 2016-12-02 MED FILL — ROSUVASTATIN CALCIUM 10 MG: 10 | 84 days supply | Qty: 12 | Fill #3

## 2016-12-02 MED FILL — FLUTICASONE PROP 50 MCG SPR: 50 | 90 days supply | Qty: 48 | Fill #0

## 2016-12-02 MED FILL — CARTIA XT 120 MG CAPSULE SA: 120 | 90 days supply | Qty: 90 | Fill #0

## 2016-12-03 ENCOUNTER — Ambulatory Visit (HOSPITAL_COMMUNITY)
Admission: RE | Admit: 2016-12-03 | Discharge: 2016-12-03 | Disposition: A | Discharge status: Home / Self Care | Payer: 59 | Source: Ambulatory Visit | Attending: Nurse Practitioner | Admitting: Nurse Practitioner

## 2016-12-03 ENCOUNTER — Telehealth (HOSPITAL_COMMUNITY): Payer: Self-pay | Admitting: *Deleted

## 2016-12-03 ENCOUNTER — Encounter (HOSPITAL_COMMUNITY): Payer: Self-pay | Admitting: Nurse Practitioner

## 2016-12-03 VITALS — BP 134/82 | HR 72 | Ht 72.0 in

## 2016-12-03 DIAGNOSIS — I452 Bifascicular block: Secondary | ICD-10-CM

## 2016-12-03 DIAGNOSIS — F1010 Alcohol abuse, uncomplicated: Secondary | ICD-10-CM | POA: Diagnosis not present

## 2016-12-03 DIAGNOSIS — I440 Atrioventricular block, first degree: Secondary | ICD-10-CM | POA: Diagnosis not present

## 2016-12-03 DIAGNOSIS — E7800 Pure hypercholesterolemia, unspecified: Secondary | ICD-10-CM | POA: Insufficient documentation

## 2016-12-03 DIAGNOSIS — I4510 Unspecified right bundle-branch block: Secondary | ICD-10-CM | POA: Insufficient documentation

## 2016-12-03 DIAGNOSIS — I480 Paroxysmal atrial fibrillation: Secondary | ICD-10-CM | POA: Insufficient documentation

## 2016-12-03 NOTE — Telephone Encounter (Signed)
Pt walked in to afib clinic stating he has been in afib since about 4am this morning. His HR is running in the 140s. He has taken cardizem 30mg  at 4am and again at 8am. BP is 110/80 HR 150 upon ascultation. Instructed pt to take another 30mg  of cardizem now and can repeat in 4 hours. Appt made for this afternoon to be seen. Pt verbalized understanding.

## 2016-12-03 NOTE — Progress Notes (Signed)
Primary Care Physician: Eulas Post, MD Primary Electrophysiologist: Raymondo Band is a 56 y.o. male with a history of paroxysmal atrial fibrillation who presents for follow up in the [facility]. He reports an episode of AF this morning that lasted several hours. He took 2 prn cardizem without restoration of SR. He walked down to the AF clinic this morning and BP was ok, so he was advised to take an extra 30mg  of Cardizem.  He then went back into SR. Review of strips from [place] monitor demonstrate a regular wide complex tachycardia that appears to be atrial flutter.  He is symptomatically improved now but not yet back at baseline with ongoing fatigue.  He thinks this episode may have been triggered by some increased stress and drinking 2 beers last night. Today, he  denies symptoms of chest pain, shortness of breath, orthopnea, PND, lower extremity edema, dizziness, presyncope, syncope, snoring, daytime somnolence, bleeding, or neurologic sequela. The patient is tolerating medications without difficulties and is otherwise without complaint today.    Atrial Fibrillation Risk Factors:  he does not have symptoms or diagnosis of sleep apnea.  he does not have a history of rheumatic fever.  he does have a history of alcohol use.  LA size: 49   Atrial Fibrillation Management history:  Previous antiarrhythmic drugs: none  Previous cardioversions: none  Previous ablations: none  CHADS2VASC score: 0  Anticoagulation history: none   Past Medical History:  Diagnosis Date  . High cholesterol   . Paroxysmal atrial fibrillation (HCC)   . RBBB (right bundle branch block with left anterior fascicular block) 2016   Past Surgical History:  Procedure Laterality Date  . EYE SURGERY     lasik 2001  . [place]   left  . INGUINAL HERNIA REPAIR Right 06/05/2014   Procedure: OPEN RIGHT INGUINAL HERNIA REPAIR;  Surgeon: Ralene Ok,  MD;  Location: [place];  Service: General;  Laterality: Right;  . INSERTION OF MESH Right 06/05/2014   Procedure: INSERTION OF MESH;  Surgeon: Ralene Ok, MD;  Location: [place];  Service: General;  Laterality: Right;    Current Outpatient Prescriptions  Medication Sig Dispense Refill  . CARTIA XT 120 MG 24 hr capsule TAKE 1 CAPSULE BY MOUTH DAILY. 90 capsule 3  . diltiazem (CARDIZEM) 30 MG tablet Cardizem 30mg  -- take 1-2 tablets every 4 hours AS NEEDED for heart rate >100 as long as blood pressure >100. 45 tablet 3  . fish oil-omega-3 fatty acids 1000 MG capsule Take 2 g by mouth daily.    . fluticasone (FLONASE) 50 MCG/ACT nasal spray USE 2 SPRAYS IN EACH NOSTRIL ONCE DAILY 48 g 3  . MULTIPLE VITAMIN PO Take 1 tablet by mouth daily.     . rosuvastatin (CRESTOR) 10 MG tablet TAKE 1 TABLET BY MOUTH ONCE A WEEK. 30 tablet 11   Current Facility-Administered Medications  Medication Dose Route Frequency Provider Last Rate Last Dose  . 0.9 %  sodium chloride infusion  500 mL Intravenous Continuous Manus Gunning, MD        Allergies  Allergen Reactions  . Codeine Nausea Only    Social History   Social History  . Marital status: Married    Spouse name: N/A  . Number of children: N/A  . Years of education: N/A   Occupational History  . Not on file.   Social History Main Topics  . Smoking status: Never Smoker  .  Smokeless tobacco: Never Used  . Alcohol use Yes     Comment: 6-8 glasses per week  . Drug use: No  . Sexual activity: Not on file   Other Topics Concern  . 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 COPD Mother   . COPD Father   . Esophageal cancer Neg Hx   . Colon cancer Neg Hx   . Stomach cancer Neg Hx     ROS- All systems are reviewed and negative except as per the HPI above.  Physical Exam: Vitals:   12/03/16 1308  BP: 134/82  Pulse: 72    Height: 6' (1.829 m)    GEN- The patient is well appearing, alert and oriented x 3 today.   Head- normocephalic, atraumatic Eyes-  Sclera clear, conjunctiva pink Ears- hearing intact Oropharynx- clear Neck- supple  Lungs- Clear to ausculation bilaterally, normal work of breathing Heart- Regular rate and rhythm, no murmurs, rubs or gallops  GI- soft, NT, ND, + BS Extremities- no clubbing, cyanosis, or edema MS- no significant deformity or atrophy Skin- no rash or lesion Psych- euthymic mood, full affect Neuro- strength and sensation are intact  Wt Readings from Last 3 Encounters:  09/05/16 195 lb (88.5 kg)  08/29/16 195 lb 12.8 oz (88.8 kg)  03/28/16 195 lb 8 oz (88.7 kg)    EKG today demonstrates sinus rhythm, rate 72, RBBB, LAFB, 1st degree AV block, PR 232msec, QRS 171msec Echo 12/2015 demonstrated EF 60-65%, no RWMA, grade 1 diastolic dysfunction  Epic records are reviewed at length today  Assessment and Plan:  1. Paroxysmal atrial fibrillation Terminated with 3 Cardizem 30mg  tablets AAD options are limited with underlying RBBB If AF burden increases, consider Multaq - discussed with patient today CHADS2VASC is 0  2.  ETOH He is aware that alcohol is a trigger for AF  3.  RBBB/LAFB/1st degree AV block No symptoms Will continue to monitor  Follow up with AF clinic as needed   Chanetta Marshall, NP 12/03/2016 1:24 PM

## 2017-01-13 DIAGNOSIS — H5202 Hypermetropia, left eye: Secondary | ICD-10-CM | POA: Diagnosis not present

## 2017-01-13 DIAGNOSIS — H5211 Myopia, right eye: Secondary | ICD-10-CM | POA: Diagnosis not present

## 2017-01-13 DIAGNOSIS — H25011 Cortical age-related cataract, right eye: Secondary | ICD-10-CM | POA: Diagnosis not present

## 2017-01-19 ENCOUNTER — Other Ambulatory Visit (HOSPITAL_COMMUNITY): Payer: Self-pay | Admitting: Internal Medicine

## 2017-01-20 MED FILL — dilTIAZem HCL 30 MG TABS: 30 | 4 days supply | Qty: 45 | Fill #0

## 2017-03-17 MED FILL — CARTIA XT 120 MG CAPSULE SA: 120 | 90 days supply | Qty: 90 | Fill #1

## 2017-04-08 ENCOUNTER — Ambulatory Visit (INDEPENDENT_AMBULATORY_CARE_PROVIDER_SITE_OTHER): Payer: 59 | Admitting: Family Medicine

## 2017-04-08 ENCOUNTER — Encounter: Payer: Self-pay | Admitting: Family Medicine

## 2017-04-08 VITALS — BP 120/80 | HR 80 | Temp 98.4°F | Ht 71.5 in | Wt 194.1 lb

## 2017-04-08 DIAGNOSIS — Z0000 Encounter for general adult medical examination without abnormal findings: Secondary | ICD-10-CM | POA: Diagnosis not present

## 2017-04-08 LAB — HEPATIC FUNCTION PANEL
ALK PHOS: 68 U/L (ref 39–117)
ALT: 70 U/L — ABNORMAL HIGH (ref 0–53)
AST: 49 U/L — AB (ref 0–37)
Albumin: 4.4 g/dL (ref 3.5–5.2)
BILIRUBIN TOTAL: 0.6 mg/dL (ref 0.2–1.2)
Bilirubin, Direct: 0.1 mg/dL (ref 0.0–0.3)
Total Protein: 6.7 g/dL (ref 6.0–8.3)

## 2017-04-08 LAB — LIPID PANEL
CHOLESTEROL: 213 mg/dL — AB (ref 0–200)
HDL: 62.1 mg/dL (ref >39.00)
LDL Cholesterol: 131 mg/dL — ABNORMAL HIGH (ref 0–99)
NONHDL: 150.71
Total CHOL/HDL Ratio: 3
Triglycerides: 98 mg/dL (ref 0.0–149.0)
VLDL: 19.6 mg/dL (ref 0.0–40.0)

## 2017-04-08 LAB — CBC WITH DIFFERENTIAL/PLATELET
BASOS PCT: 0.6 % (ref 0.0–3.0)
Basophils Absolute: 0 10*3/uL (ref 0.0–0.1)
EOS PCT: 1.1 % (ref 0.0–5.0)
Eosinophils Absolute: 0.1 10*3/uL (ref 0.0–0.7)
HEMATOCRIT: 42.5 % (ref 39.0–52.0)
HEMOGLOBIN: 14.6 g/dL (ref 13.0–17.0)
LYMPHS PCT: 20.8 % (ref 12.0–46.0)
Lymphs Abs: 1.2 10*3/uL (ref 0.7–4.0)
MCHC: 34.2 g/dL (ref 30.0–36.0)
MCV: 94.6 fl (ref 78.0–100.0)
MONO ABS: 0.7 10*3/uL (ref 0.1–1.0)
Monocytes Relative: 12 % (ref 3.0–12.0)
Neutro Abs: 3.9 10*3/uL (ref 1.4–7.7)
Neutrophils Relative %: 65.5 % (ref 43.0–77.0)
Platelets: 235 10*3/uL (ref 150.0–400.0)
RBC: 4.5 Mil/uL (ref 4.22–5.81)
RDW: 12.6 % (ref 11.5–15.5)
WBC: 6 10*3/uL (ref 4.0–10.5)

## 2017-04-08 LAB — BASIC METABOLIC PANEL
BUN: 14 mg/dL (ref 6–23)
CALCIUM: 9.2 mg/dL (ref 8.4–10.5)
CO2: 29 mEq/L (ref 19–32)
CREATININE: 0.79 mg/dL (ref 0.40–1.50)
Chloride: 102 mEq/L (ref 96–112)
GFR: 107.17 mL/min (ref >60.00)
GLUCOSE: 87 mg/dL (ref 70–99)
Potassium: 4.1 mEq/L (ref 3.5–5.1)
Sodium: 139 mEq/L (ref 135–145)

## 2017-04-08 LAB — TSH: TSH: 2.18 u[IU]/mL (ref 0.35–4.50)

## 2017-04-08 LAB — PSA: PSA: 2.06 ng/mL (ref 0.10–4.00)

## 2017-04-08 NOTE — Progress Notes (Signed)
Subjective:     Patient ID: Tony Silva, male   DOB: [birth year], 56 y.o.   MRN: [id number]  HPI Patient here for physical exam.  He has history of atrial fibrillation and hyperlipidemia. Intermittent bouts of atrial fibrillation usually controlled with diltiazem. He has noted that dehydration seems to be a trigger and possibly wine consumption. No recent chest pains. No cardiac history otherwise. He recently discontinued taking Crestor secondary myalgias.  He is looking at potential retirement within the next year. Plans to do lots of travel and sail boating. Requesting compounded scopolamine capsules which has taken in the past. He does not have any contraindications. No history of hepatitis C screening. History of colon polyps and will need repeat colonoscopy in a couple years.  Never smoked.  Past Medical History:  Diagnosis Date  . High cholesterol   . Paroxysmal atrial fibrillation (HCC)   . RBBB (right bundle branch block with left anterior fascicular block) 2016   Past Surgical History:  Procedure Laterality Date  . EYE SURGERY     lasik 2001  . [place]   left  . INGUINAL HERNIA REPAIR Right 06/05/2014   Procedure: OPEN RIGHT INGUINAL HERNIA REPAIR;  Surgeon: Ralene Ok, MD;  Location: [place];  Service: General;  Laterality: Right;  . INSERTION OF MESH Right 06/05/2014   Procedure: INSERTION OF MESH;  Surgeon: Ralene Ok, MD;  Location: [place];  Service: General;  Laterality: Right;    reports that he has never smoked. He has never used smokeless tobacco. He reports that he drinks alcohol. He reports that he does not use drugs. family history includes COPD in his father and mother. Allergies  Allergen Reactions  . Codeine Nausea Only     Review of Systems  Constitutional: Negative for activity change, appetite change, fatigue and fever.  HENT: Negative for congestion, ear pain and trouble swallowing.   Eyes: Negative for pain and visual disturbance.   Respiratory: Negative for cough, shortness of breath and wheezing.   Cardiovascular: Negative for chest pain and palpitations.  Gastrointestinal: Negative for abdominal distention, abdominal pain, blood in stool, constipation, diarrhea, nausea, rectal pain and vomiting.  Endocrine: Negative for polydipsia and polyuria.  Genitourinary: Negative for dysuria, hematuria and testicular pain.  Musculoskeletal: Negative for arthralgias and joint swelling.  Skin: Negative for rash.  Neurological: Negative for dizziness, syncope and headaches.  Hematological: Negative for adenopathy.  Psychiatric/Behavioral: Negative for confusion and dysphoric mood.       Objective:   Physical Exam  Constitutional: He is oriented to person, place, and time. He appears well-developed and well-nourished. No distress.  HENT:  Head: Normocephalic and atraumatic.  Right Ear: External ear normal.  Left Ear: External ear normal.  Mouth/Throat: Oropharynx is clear and moist.  Eyes: Conjunctivae and EOM are normal. Pupils are equal, round, and reactive to light.  Neck: Normal range of motion. Neck supple. No thyromegaly present.  Cardiovascular: Normal rate, regular rhythm and normal heart sounds.   No murmur heard. Pulmonary/Chest: No respiratory distress. He has no wheezes. He has no rales.  Abdominal: Soft. Bowel sounds are normal. He exhibits no distension and no mass. There is no tenderness. There is no rebound and no guarding.  Musculoskeletal: He exhibits no edema.  Lymphadenopathy:    He has no cervical adenopathy.  Neurological: He is alert and oriented to person, place, and time. He displays normal reflexes. No cranial nerve deficit.  Skin: No rash noted.  Psychiatric: He has  a normal mood and affect.       Assessment:     Physical exam. History of paroxysmal atrial fibrillation currently sinus rhythm    Plan:     -Recheck labs. Include hepatitis C antibody since this has not been screened  previously -The natural history of prostate cancer and ongoing controversy regarding screening and potential treatment outcomes of prostate cancer has been discussed with the patient. The meaning of a false positive PSA and a false negative PSA has been discussed. He indicates understanding of the limitations of this screening test and wishes  to proceed with screening PSA testing. -We'll check with local compounding pharmacy to see about getting scopolamine compounded 0.2 mg capsule to prevent motion sickness  Eulas Post MD [facility]

## 2017-04-09 ENCOUNTER — Encounter: Payer: Self-pay | Admitting: Family Medicine

## 2017-04-09 LAB — HEPATITIS C ANTIBODY: HCV Ab: NEGATIVE

## 2017-04-14 ENCOUNTER — Other Ambulatory Visit: Payer: Self-pay | Admitting: Family Medicine

## 2017-04-14 DIAGNOSIS — R7989 Other specified abnormal findings of blood chemistry: Secondary | ICD-10-CM

## 2017-04-14 DIAGNOSIS — R945 Abnormal results of liver function studies: Secondary | ICD-10-CM

## 2017-05-14 MED FILL — dilTIAZem HCL 30 MG TABS: 30 | 4 days supply | Qty: 45 | Fill #1

## 2017-05-18 ENCOUNTER — Other Ambulatory Visit: Payer: Self-pay

## 2017-05-22 ENCOUNTER — Other Ambulatory Visit (INDEPENDENT_AMBULATORY_CARE_PROVIDER_SITE_OTHER): Payer: 59

## 2017-05-22 DIAGNOSIS — R945 Abnormal results of liver function studies: Secondary | ICD-10-CM

## 2017-05-22 DIAGNOSIS — R7989 Other specified abnormal findings of blood chemistry: Secondary | ICD-10-CM

## 2017-05-22 LAB — HEPATIC FUNCTION PANEL
ALK PHOS: 55 U/L (ref 39–117)
ALT: 31 U/L (ref 0–53)
AST: 22 U/L (ref 0–37)
Albumin: 4.3 g/dL (ref 3.5–5.2)
BILIRUBIN DIRECT: 0.1 mg/dL (ref 0.0–0.3)
TOTAL PROTEIN: 6.5 g/dL (ref 6.0–8.3)
Total Bilirubin: 0.5 mg/dL (ref 0.2–1.2)

## 2017-05-24 ENCOUNTER — Encounter: Payer: Self-pay | Admitting: Family Medicine

## 2017-06-15 MED FILL — CARTIA XT 120 MG CAPSULE SA: 120 | 90 days supply | Qty: 90 | Fill #2

## 2017-06-24 ENCOUNTER — Other Ambulatory Visit: Payer: Self-pay | Admitting: *Deleted

## 2017-06-24 DIAGNOSIS — I480 Paroxysmal atrial fibrillation: Secondary | ICD-10-CM

## 2017-06-24 NOTE — Progress Notes (Signed)
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 He has seen me in the past as well as Dr. Rayann Heman and Roderic Palau.  Thanks   Candee Furbish, MD

## 2017-07-02 ENCOUNTER — Ambulatory Visit (HOSPITAL_COMMUNITY): Payer: 59 | Attending: Cardiovascular Disease

## 2017-07-02 ENCOUNTER — Other Ambulatory Visit: Payer: Self-pay

## 2017-07-02 DIAGNOSIS — I429 Cardiomyopathy, unspecified: Secondary | ICD-10-CM | POA: Insufficient documentation

## 2017-07-02 DIAGNOSIS — I480 Paroxysmal atrial fibrillation: Secondary | ICD-10-CM

## 2017-07-02 DIAGNOSIS — E785 Hyperlipidemia, unspecified: Secondary | ICD-10-CM | POA: Diagnosis not present

## 2017-07-02 DIAGNOSIS — I4510 Unspecified right bundle-branch block: Secondary | ICD-10-CM | POA: Insufficient documentation

## 2017-07-02 DIAGNOSIS — I083 Combined rheumatic disorders of mitral, aortic and tricuspid valves: Secondary | ICD-10-CM | POA: Diagnosis not present

## 2017-07-02 LAB — ECHOCARDIOGRAM COMPLETE
AOASC: 41 cm
CHL CUP DOP CALC LVOT VTI: 22 cm
CHL CUP MV DEC (S): 257
E decel time: 257 msec
E/e' ratio: 4.2
FS: 30 % (ref 28–44)
IVS/LV PW RATIO, ED: 0.99
LA diam end sys: 43 mm
LA diam index: 2.06 cm/m2
LASIZE: 43 mm
LAVOL: 66.3 mL
LAVOLA4C: 52.1 mL
LAVOLIN: 31.7 mL/m2
LV E/e' medial: 4.2
LVEEAVG: 4.2
LVELAT: 12.8 cm/s
LVOT SV: 76 mL
LVOT area: 3.46 cm2
LVOT diameter: 21 mm
LVOT peak vel: 100 cm/s
Lateral S' vel: 12 cm/s
MV pk A vel: 81.5 m/s
MVPKEVEL: 53.8 m/s
PW: 11.3 mm — AB (ref 0.6–1.1)
TAPSE: 20.2 mm
TDI e' lateral: 12.8
TDI e' medial: 5.98

## 2017-07-16 ENCOUNTER — Encounter: Payer: Self-pay | Admitting: Family Medicine

## 2017-08-07 DIAGNOSIS — L820 Inflamed seborrheic keratosis: Secondary | ICD-10-CM | POA: Diagnosis not present

## 2017-09-10 MED FILL — dilTIAZem HCL 30 MG TABS: 30 | 4 days supply | Qty: 45 | Fill #2

## 2017-09-10 MED FILL — CARTIA XT 120 MG CAPSULE SA: 120 | 90 days supply | Qty: 90 | Fill #3

## 2017-11-24 DIAGNOSIS — H43813 Vitreous degeneration, bilateral: Secondary | ICD-10-CM | POA: Diagnosis not present

## 2017-12-07 ENCOUNTER — Other Ambulatory Visit: Payer: Self-pay | Admitting: Family Medicine

## 2017-12-07 MED FILL — dilTIAZem HCL 30 MG TABS: 30 | 4 days supply | Qty: 45 | Fill #3

## 2017-12-07 MED FILL — CARTIA XT 120 MG CAPSULE SA: 120 | 90 days supply | Qty: 90 | Fill #0

## 2017-12-08 ENCOUNTER — Other Ambulatory Visit (HOSPITAL_COMMUNITY): Payer: Self-pay | Admitting: *Deleted

## 2017-12-08 MED ORDER — DILTIAZEM HCL 30 MG PO TABS: ORAL_TABLET | ORAL | 3 refills | Status: AC

## 2017-12-29 DIAGNOSIS — H43813 Vitreous degeneration, bilateral: Secondary | ICD-10-CM | POA: Diagnosis not present

## 2018-01-19 ENCOUNTER — Encounter: Payer: Self-pay | Admitting: Family Medicine

## 2018-01-19 ENCOUNTER — Ambulatory Visit: Payer: 59 | Admitting: Family Medicine

## 2018-01-19 VITALS — BP 120/80 | HR 62 | Temp 98.2°F | Wt 196.5 lb

## 2018-01-19 DIAGNOSIS — N451 Epididymitis: Secondary | ICD-10-CM | POA: Diagnosis not present

## 2018-01-19 MED ORDER — LEVOFLOXACIN 500 MG PO TABS: 500.0000 mg | ORAL_TABLET | Freq: Every day | ORAL | 0 refills | Status: DC

## 2018-01-19 MED FILL — levoFLOXacin 500 MG TABS: 500 | 10 days supply | Qty: 10 | Fill #0

## 2018-01-19 NOTE — Patient Instructions (Signed)
Epididymitis Epididymitis is swelling (inflammation) of the epididymis. The epididymis is a cord-like structure that is located along the top and back part of the testicle. It collects and stores sperm from the testicle. This condition can also cause pain and swelling of the testicle and scrotum. Symptoms usually start suddenly (acute epididymitis). Sometimes epididymitis starts gradually and lasts for a while (chronic epididymitis). This type may be harder to treat. What are the causes? In men 35 and younger, this condition is usually caused by a bacterial infection or sexually transmitted disease (STD), such as:  Gonorrhea.  Chlamydia.  In men 35 and older who do not have anal sex, this condition is usually caused by bacteria from a blockage or abnormalities in the urinary system. These can result from:  Having a tube placed into the bladder (urinary catheter).  Having an enlarged or inflamed prostate gland.  Having recent urinary tract surgery.  In men who have a condition that weakens the body's defense system (immune system), such as HIV, this condition can be caused by:  Other bacteria, including tuberculosis and syphilis.  Viruses.  Fungi.  Sometimes this condition occurs without infection. That may happen if urine flows backward into the epididymis after heavy lifting or straining. What increases the risk? This condition is more likely to develop in men:  Who have unprotected sex with more than one partner.  Who have anal sex.  Who have recently had surgery.  Who have a urinary catheter.  Who have urinary problems.  Who have a suppressed immune system.  What are the signs or symptoms? This condition usually begins suddenly with chills, fever, and pain behind the scrotum and in the testicle. Other symptoms include:  Swelling of the scrotum, testicle, or both.  Pain whenejaculatingor urinating.  Pain in the back or belly.  Nausea.  Itching and discharge  from the penis.  Frequent need to pass urine.  Redness and tenderness of the scrotum.  How is this diagnosed? Your health care provider can diagnose this condition based on your symptoms and medical history. Your health care provider will also do a physical exam to ask about your symptoms and check your scrotum and testicle for swelling, pain, and redness. You may also have other tests, including:  Examination of discharge from the penis.  Urine tests for infections, such as STDs.  Your health care provider may test you for other STDs, including HIV. How is this treated? Treatment for this condition depends on the cause. If your condition is caused by a bacterial infection, oral antibiotic medicine may be prescribed. If the bacterial infection has spread to your blood, you may need to receive IV antibiotics. Nonbacterial epididymitis is treated with home care that includes bed rest and elevation of the scrotum. Surgery may be needed to treat:  Bacterial epididymitis that causes pus to build up in the scrotum (abscess).  Chronic epididymitis that has not responded to other treatments.  Follow these instructions at home: Medicines  Take over-the-counter and prescription medicines only as told by your health care provider.  If you were prescribed an antibiotic medicine, take it as told by your health care provider. Do not stop taking the antibiotic even if your condition improves. Sexual Activity  If your epididymitis was caused by an STD, avoid sexual activity until your treatment is complete.  Inform your sexual partner or partners if you test positive for an STD. They may need to be treated.Do not engage in sexual activity with your partner or   partners until their treatment is completed. General instructions  Return to your normal activities as told by your health care provider. Ask your health care provider what activities are safe for you.  Keep your scrotum elevated and  supported while resting. Ask your health care provider if you should wear a scrotal support, such as a jockstrap. Wear it as told by your health care provider.  If directed, apply ice to the affected area: ? Put ice in a plastic bag. ? Place a towel between your skin and the bag. ? Leave the ice on for 20 minutes, 2-3 times per day.  Try taking a sitz bath to help with discomfort. This is a warm water bath that is taken while you are sitting down. The water should only come up to your hips and should cover your buttocks. Do this 3-4 times per day or as told by your health care provider.  Keep all follow-up visits as told by your health care provider. This is important. Contact a health care provider if:  You have a fever.  Your pain medicine is not helping.  Your pain is getting worse.  Your symptoms do not improve within three days. This information is not intended to replace advice given to you by your health care provider. Make sure you discuss any questions you have with your health care provider. Document Released: 10/10/2000 Document Revised: 03/20/2016 Document Reviewed: 02/28/2015 Elsevier Interactive Patient Education  2018 Elsevier Inc.  

## 2018-01-19 NOTE — Progress Notes (Signed)
Subjective:     Patient ID: Tony Silva, male   DOB: 1960-12-04, 57 y.o.   MRN: [id number]  HPI Patient seen for acute visit. Onset a few weeks ago of right testicle pain. Pain is actually up in epididymis region. No known injury. No penile discharge. He does have prior history of right inguinal hernia repair but has not noted any recurrent hernia. He is monogamous. No history of epididymitis. No history of STD. He has noticed some increased ""warmth" and tenderness superior pole of right testicle.  Past Medical History:  Diagnosis Date  . High cholesterol   . Paroxysmal atrial fibrillation (HCC)   . RBBB (right bundle branch block with left anterior fascicular block) 2016   Past Surgical History:  Procedure Laterality Date  . EYE SURGERY     lasik 2001  . [place]   left  . INGUINAL HERNIA REPAIR Right 06/05/2014   Procedure: OPEN RIGHT INGUINAL HERNIA REPAIR;  Surgeon: Ralene Ok, MD;  Location: [facility];  Service: General;  Laterality: Right;  . INSERTION OF MESH Right 06/05/2014   Procedure: INSERTION OF MESH;  Surgeon: Ralene Ok, MD;  Location: [place];  Service: General;  Laterality: Right;    reports that he has never smoked. He has never used smokeless tobacco. He reports that he drinks alcohol. He reports that he does not use drugs. family history includes COPD in his father and mother. Allergies  Allergen Reactions  . Codeine Nausea Only     Review of Systems  Constitutional: Negative for chills and fever.  Genitourinary: Negative for discharge, dysuria, genital sores and hematuria.       Objective:   Physical Exam  Constitutional: He appears well-developed and well-nourished.  Cardiovascular: Normal rate and regular rhythm.  Pulmonary/Chest: Effort normal and breath sounds normal. No respiratory distress. He has no wheezes. He has no rales.  Genitourinary:  Genitourinary Comments: Left testicle is normal. Right testicle reveals no enlargement.  He has some tenderness along the superior pole over the epididymis region. No masses. No hernia.       Assessment:     Right testicle pain. Suspect acute epididymitis right testicle    Plan:     -Treated with Levaquin 500 mg once daily. Low clinical suspicion for chlamydia or GC given his age and he is monogamous. -Touch base in 2 weeks if symptoms not resolving  Eulas Post MD [facility] at [facility]

## 2018-02-12 MED FILL — dilTIAZem HCL 30 MG TABS: 30 | 3 days supply | Qty: 45 | Fill #0

## 2018-09-03 IMAGING — DX DG CHEST 2V
2 series · 2 of 2 positions shown · non-contrast
Comparison: 05/29/2014

CLINICAL DATA: Persistent cough for 5 days

EXAM:
CHEST  2 VIEW

[chest pa]
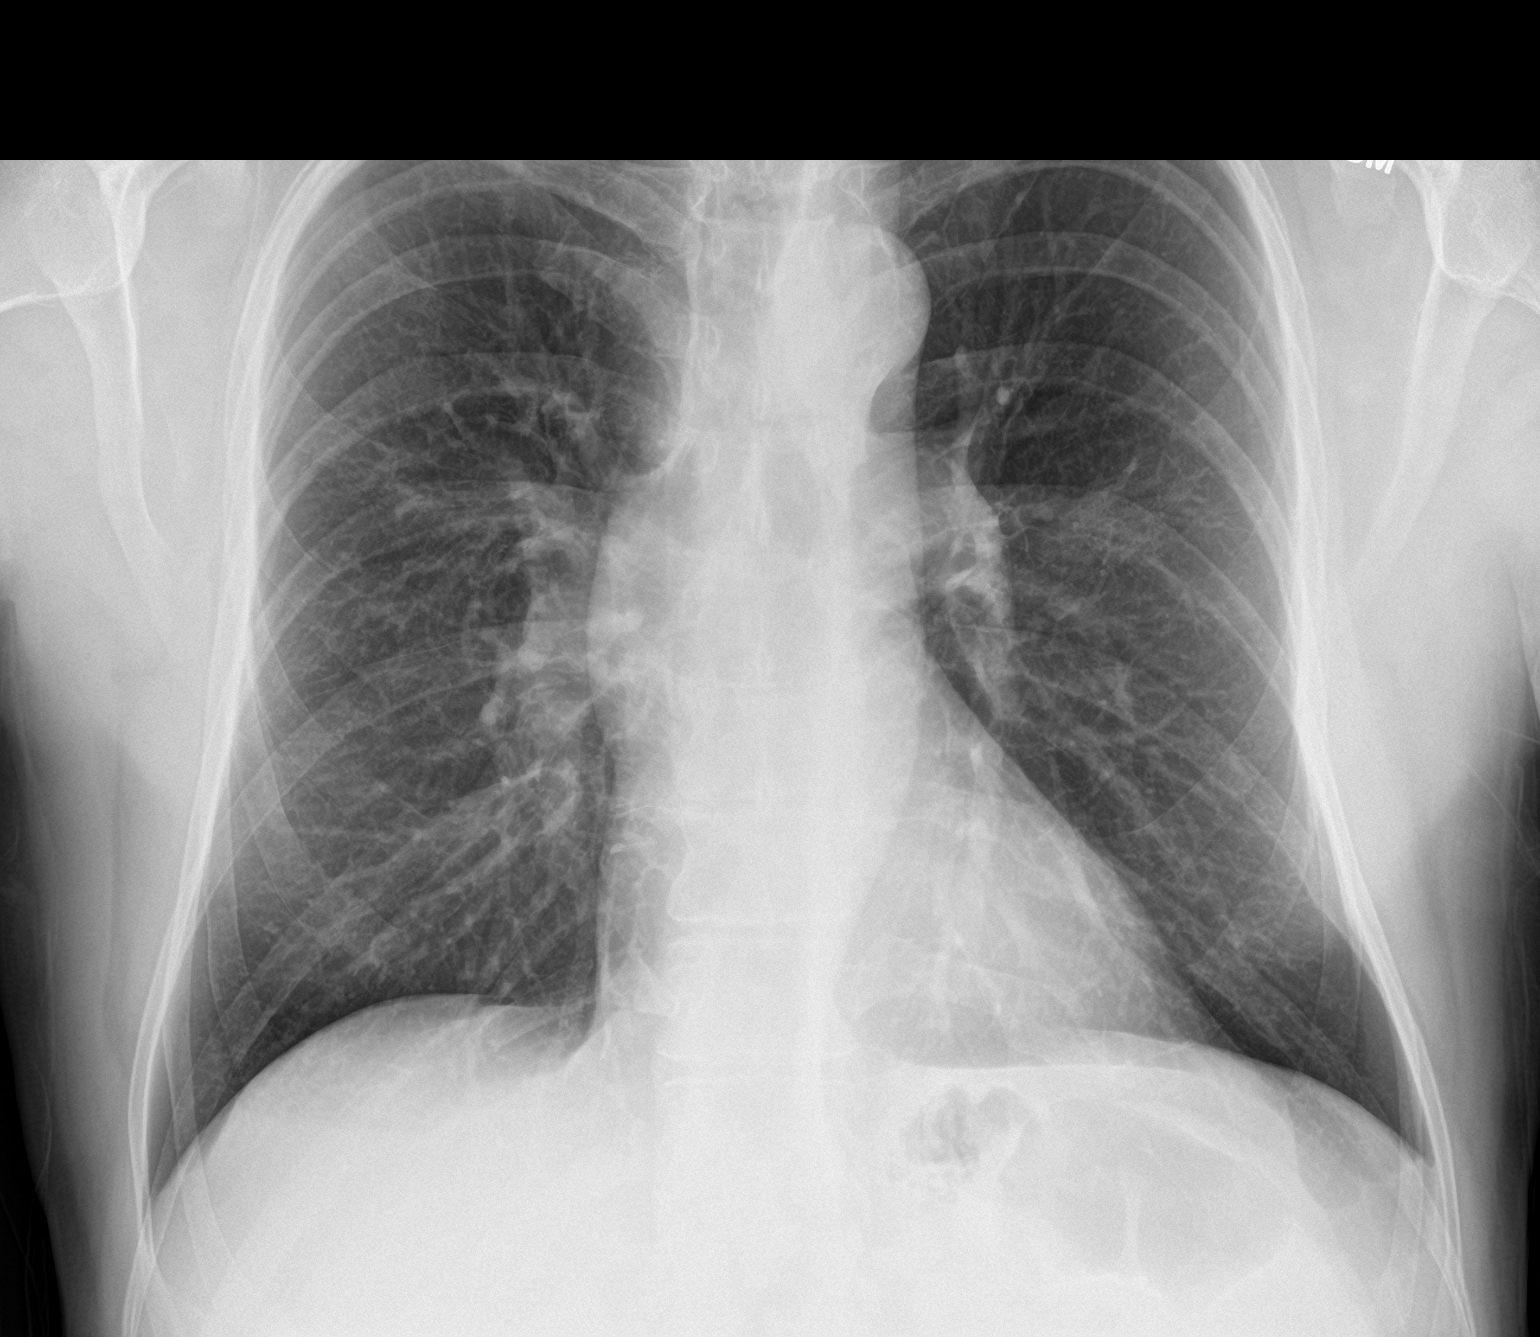

[chest lat]
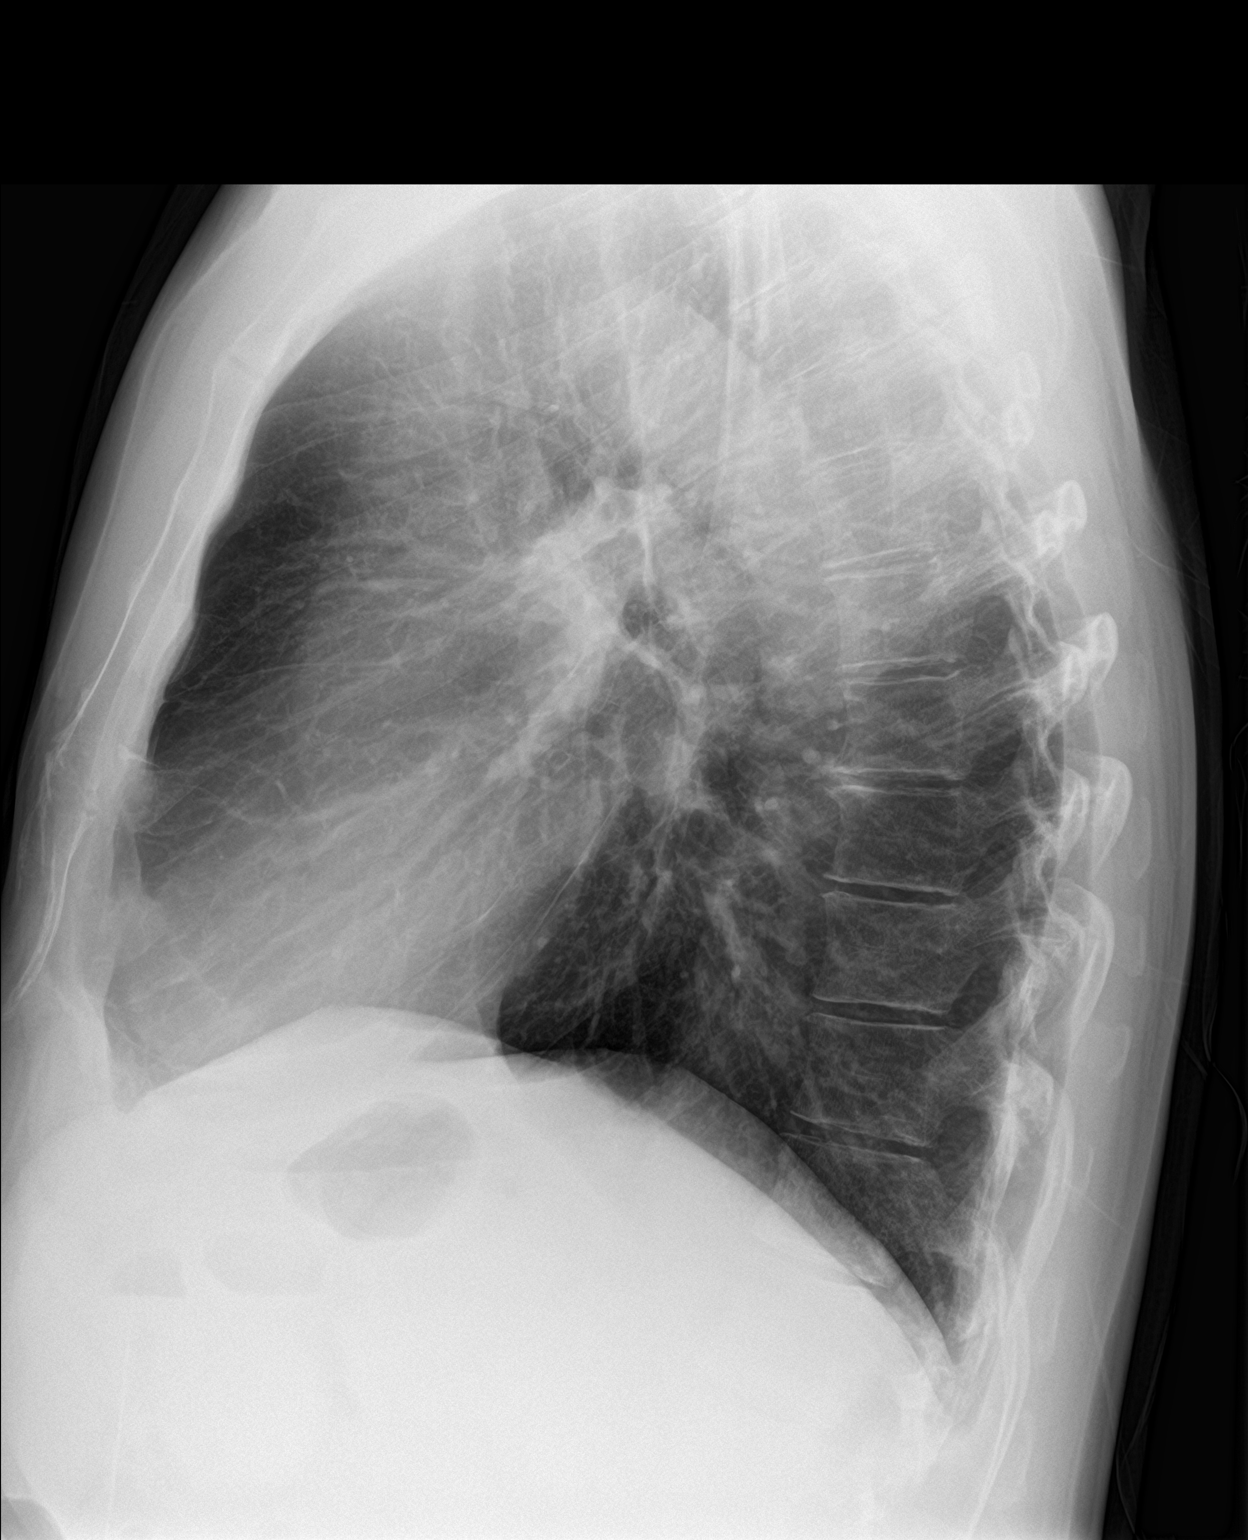

[2 of 2 positions shown; findings below may reference images not displayed]

FINDINGS: Heart and mediastinal contours are within normal limits. No focal
opacities or effusions. No acute bony abnormality.
IMPRESSION: No active cardiopulmonary disease.

## 2019-03-11 ENCOUNTER — Telehealth: Payer: Self-pay | Admitting: Cardiology

## 2019-03-11 ENCOUNTER — Telehealth (INDEPENDENT_AMBULATORY_CARE_PROVIDER_SITE_OTHER): Payer: Managed Care, Other (non HMO) | Admitting: Cardiology

## 2019-03-11 ENCOUNTER — Encounter: Payer: Self-pay | Admitting: Cardiology

## 2019-03-11 ENCOUNTER — Other Ambulatory Visit: Payer: Self-pay

## 2019-03-11 VITALS — BP 118/88 | HR 68 | Ht 71.5 in | Wt 192.0 lb

## 2019-03-11 DIAGNOSIS — E7800 Pure hypercholesterolemia, unspecified: Secondary | ICD-10-CM

## 2019-03-11 DIAGNOSIS — I440 Atrioventricular block, first degree: Secondary | ICD-10-CM

## 2019-03-11 DIAGNOSIS — I480 Paroxysmal atrial fibrillation: Secondary | ICD-10-CM

## 2019-03-11 DIAGNOSIS — I77810 Thoracic aortic ectasia: Secondary | ICD-10-CM

## 2019-03-11 DIAGNOSIS — I4510 Unspecified right bundle-branch block: Secondary | ICD-10-CM

## 2019-03-11 MED ORDER — ROSUVASTATIN CALCIUM 5 MG PO TABS: 5.0000 mg | ORAL_TABLET | Freq: Every day | ORAL | 3 refills | Status: AC

## 2019-03-11 NOTE — Patient Instructions (Signed)
Medication Instructions:  1) START Rosuvastatin 5mg  once daily  If you need a refill on your cardiac medications before your next appointment, please call your pharmacy.   Lab work: None If you have labs (blood work) drawn today and your tests are completely normal, you will receive your results only by: Marland Kitchen MyChart Message (if you have MyChart) OR . A paper copy in the mail If you have any lab test that is abnormal or we need to change your treatment, we will call you to review the results.  Testing/Procedures: Your physician has requested that you have an echocardiogram. Echocardiography is a painless test that uses sound waves to create images of your heart. It provides your doctor with information about the size and shape of your heart and how well your heart's chambers and valves are working. This procedure takes approximately one hour. There are no restrictions for this procedure.   Follow-Up: At [facility], you and your health needs are our priority.  As part of our continuing mission to provide you with exceptional heart care, we have created designated Provider Care Teams.  These Care Teams include your primary Cardiologist (physician) and Advanced Practice Providers (APPs -  Physician Assistants and Nurse Practitioners) who all work together to provide you with the care you need, when you need it. You will need a follow up appointment in 6 months.  Please call our office 2 months in advance to schedule this appointment.  You may see Dr. Marlou Porch or one of the following Advanced Practice Providers on your designated Care Team:   Truitt Merle, NP Cecilie Kicks, NP . Kathyrn Drown, NP  Any Other Special Instructions Will Be Listed Below (If Applicable).

## 2019-03-11 NOTE — Telephone Encounter (Signed)
Spoke with pt and he states [facility] office is closest for him.  Advised I would place order and have someone from [place] office reach out to get scheduled.  Pt appreciative for call. Order placed for echo and sent to [facility] scheduling pool.

## 2019-03-11 NOTE — Progress Notes (Signed)
Virtual Visit via Video Note   This visit type was conducted due to national recommendations for restrictions regarding the COVID-19 Pandemic (e.g. social distancing) in an effort to limit this patient's exposure and mitigate transmission in our community.  Due to his co-morbid illnesses, this patient is at least at moderate risk for complications without adequate follow up.  This format is felt to be most appropriate for this patient at this time.  All issues noted in this document were discussed and addressed.  A limited physical exam was performed with this format.  Please refer to the patient's chart for his consent to telehealth for [facility].   Date:  03/11/2019   ID:  Tony Silva, DOB April 03, 1961, MRN [id number]  Patient Location: Home Provider Location: Home  PCP:  Eulas Post, MD  Cardiologist:  No primary care provider on file. /[place] Electrophysiologist:  None   Evaluation Performed:  Follow-Up Visit  Chief Complaint:    History of Present Illness:    Tony Silva is a 58 y.o. male with paroxysmal atrial fibrillation, hyperlipidemia, right bundle branch block here for follow-up.  Last seen in the atrial fibrillation clinic on 12/03/2016.  At that time he reported an episode of age fibrillation that lasted several hours in the morning took 2 of the as needed Cardizem, then a third after walking down to the atrial fibrillation clinic and converted back to sinus rhythm.  Overall since then he is having very rare episodes, short-lived of atrial fibrillation perhaps once a month.  He has never had any chest pain, shortness of breath, syncope or near syncope surrounding these events.  Occasionally he feels triggers may be increased stress. Wine.  When he does begin to feel some symptoms, PACs, he will hydrate well.  Sometimes he will take an extra diltiazem tab.  This seems to help.  Overall his atrial fibrillation burden is very low, perhaps once a month lasting  a few minutes duration, at the longest 2 or 3 hours.  He has never felt syncope or near syncope with this.  Had his coronary calcium score performed in December 20 19-12, LAD-discussed starting Crestor once again.  He had some troubles previously with Zocor and Lipitor with muscle aches.  He was on Crestor 10 mg once a week at one point.  I would like for him to try Crestor 5 mg once a day and see how he does.  He supplied me with EKG strip from cardia device.  Sinus rhythm first-degree AV block PAC right bundle branch block.  No atrial fibrillation.  Denies any fevers chills nausea vomiting syncope bleeding orthopnea PND.  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 Enjoying retirement, [employer] for sailing.  The patient does not have symptoms concerning for COVID-19 infection (fever, chills, cough, or new shortness of breath).    Past Medical History:  Diagnosis Date  . High cholesterol   . Paroxysmal atrial fibrillation (HCC)   . RBBB (right bundle branch block with left anterior fascicular block) 2016   Past Surgical History:  Procedure Laterality Date  . EYE SURGERY     lasik 2001  . [place]   left  . INGUINAL HERNIA REPAIR Right 06/05/2014   Procedure: OPEN RIGHT INGUINAL HERNIA REPAIR;  Surgeon: Ralene Ok, MD;  Location: [place];  Service: General;  Laterality: Right;  . INSERTION OF MESH Right 06/05/2014   Procedure: INSERTION OF MESH;  Surgeon:  Ralene Ok, MD;  Location: [place];  Service: General;  Laterality: Right;     Current Meds  Medication Sig  . CARTIA XT 120 MG 24 hr capsule TAKE 1 CAPSULE BY MOUTH DAILY.  Marland Kitchen diltiazem (CARDIZEM) 30 MG tablet TAKE 1-2 TABLETS BY MOUTH EVERY 4 HOURS AS NEEDED FOR HEART RATE >100 AS LONG AS BLOOD PRESSURE >100.  . fish oil-omega-3 fatty acids 1000 MG capsule Take 2 g by mouth daily.  . fluticasone (FLONASE) 50 MCG/ACT nasal spray USE 2 SPRAYS IN EACH NOSTRIL ONCE DAILY  .  MULTIPLE VITAMIN PO Take 1 tablet by mouth daily.    Current Facility-Administered Medications for the 03/11/19 encounter (Telemedicine) with Jerline Pain, MD  Medication  . 0.9 %  sodium chloride infusion     Allergies:   Codeine   Social History   Tobacco Use  . Smoking status: Never Smoker  . Smokeless tobacco: Never Used  Substance Use Topics  . Alcohol use: Yes    Comment: 6-8 glasses per week  . Drug use: No     Family Hx: The patient's family history includes COPD in his father and mother. There is no history of Esophageal cancer, Colon cancer, or Stomach cancer.  ROS:   Please see the history of present illness.    Denies any fevers chills nausea vomiting syncope bleeding All other systems reviewed and are negative.   Prior CV studies:   The following studies were reviewed today:  Echocardiogram 07/02/2017: - Left ventricle: Moderate basal septal hypertrophy no SAM. The   cavity size was normal. Systolic function was normal. The   estimated ejection fraction was in the range of 55% to 60%. Left   ventricular diastolic function parameters were normal. - Aortic valve: There was mild regurgitation. - Aorta: Mild dilatation of the sinus and ascending aortic root.75mm - Mitral valve: Flat closure of mitral leaflets with no frank   prolapse. There was mild regurgitation. - Left atrium: The atrium was mildly dilated. - Right atrium: The atrium was mildly dilated. - Atrial septum: A patent foramen ovale cannot be excluded.  Coronary calcium score 06/21/2013: 0, excellent  Labs/Other Tests and Data Reviewed:    EKG:  The patient's [facility] cardiac telemetry strip(s) personally reviewed today demonstrate:  Sinus rhythm, occasional PAC, right bundle branch block, normal rate  Recent Labs: No results found for requested labs within last 8760 hours.   Recent Lipid Panel Lab Results  Component Value Date/Time   CHOL 213 (H) 04/08/2017 10:39 AM   TRIG 98.0  04/08/2017 10:39 AM   HDL 62.10 04/08/2017 10:39 AM   CHOLHDL 3 04/08/2017 10:39 AM   LDLCALC 131 (H) 04/08/2017 10:39 AM    Wt Readings from Last 3 Encounters:  03/11/19 192 lb (87.1 kg)  01/19/18 196 lb 8 oz (89.1 kg)  04/08/17 194 lb 1.6 oz (88 kg)     Objective:    Vital Signs:  BP 118/88   Pulse 68   Ht 5' 11.5" (1.816 m)   Wt 192 lb (87.1 kg)   BMI 26.41 kg/m    VITAL SIGNS:  reviewed GEN:  no acute distress EYES:  sclerae anicteric, EOMI - Extraocular Movements Intact RESPIRATORY:  normal respiratory effort, symmetric expansion SKIN:  no rash, lesions or ulcers. MUSCULOSKELETAL:  no obvious deformities. NEURO:  alert and oriented x 3, no obvious focal deficit PSYCH:  normal affect  ASSESSMENT & PLAN:    Paroxysmal atrial fibrillation -Very rare episodes.  PRN Cardizem helps.  Doing quite well.  Normal ejection fraction at last check.  He knows to hydrate well if he begins to feel this, he also takes an extra diltiazem if needed.  He understands that if he begins to have more frequent episodes or more symptomatic episodes, he can always discuss ablation therapy with Dr. Rayann Heman. -CHADSVASc score is 0.  No prior cardioversions.  No prior antiarrhythmic drugs.  No prior ablations.   Hyperlipidemia - Was on Crestor 10 mg. Had with lipitor and zocor.  LDL 141, HDL 60, TC 250. 02/2018. Doing well.  No myalgias.  Excellent prevention.  Also taking fish oil.  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  Mildly dilated aortic root - 41 mm on echocardiogram 07/02/2017.  We will repeat echocardiogram.  Mild associated aortic regurgitation.  I filled out paperwork for him.  We will have it scanned and emailed to him.    COVID-19 Education: The signs and symptoms of COVID-19 were discussed with the patient and how to seek care for testing (follow up with PCP or arrange E-visit).  The importance of social distancing was discussed today.   Time:   Today, I have spent 18 minutes with the patient with telehealth technology discussing the above problems.     Medication Adjustments/Labs and Tests Ordered: Current medicines are reviewed at length with the patient today.  Concerns regarding medicines are outlined above.   Tests Ordered: No orders of the defined types were placed in this encounter.   Medication Changes: Meds ordered this encounter  Medications  . rosuvastatin (CRESTOR) 5 MG tablet    Sig: Take 1 tablet (5 mg total) by mouth daily.    Dispense:  90 tablet    Refill:  3    Disposition:  Follow up in 6 month(s)  Signed, Candee Furbish, MD  03/11/2019 12:29 PM    [place]

## 2019-03-11 NOTE — Telephone Encounter (Signed)
Called and left message for pt to call back.  Pt currently lives in [place].  Was going to inquire if pt plans to find somewhere down there to do echo or if he preferred to come up here to have it done.

## 2019-03-15 ENCOUNTER — Encounter: Payer: Self-pay | Admitting: *Deleted

## 2019-03-15 ENCOUNTER — Encounter: Payer: Self-pay | Admitting: Cardiology

## 2019-03-16 ENCOUNTER — Telehealth: Payer: Self-pay | Admitting: Cardiology

## 2019-03-16 NOTE — Telephone Encounter (Signed)
Signed "Demonstration of Physical Ability" form scanned into chart. Mailed to patient at: [address], [address] [place], [place] [address]

## 2019-03-17 ENCOUNTER — Telehealth: Payer: Self-pay | Admitting: *Deleted

## 2019-03-17 NOTE — Telephone Encounter (Signed)
   [facility] Pre-operative Risk Assessment    Request for surgical clearance:  1. What type of surgery is being performed? L4-5 FAR LATERAL MICRODISKECTOMY; SPINAL SURGERY  2. When is this surgery scheduled? 04/11/19  3. What type of clearance is required (medical clearance vs. Pharmacy clearance to hold med vs. Both)? MEDICAL  4. Are there any medications that need to be held prior to surgery and how long? NONE LISTED  5. Practice name and name of physician performing surgery? [facility] ; [facility]  6. What is your office phone number [phone]   7.   What is your office fax number [phone]  8.   Anesthesia type (None, local, MAC, general) GENERAL   Julaine Hua 03/17/2019, 4:50 PM  _________________________________________________________________   (provider comments below)

## 2019-03-18 ENCOUNTER — Telehealth: Payer: Self-pay | Admitting: Cardiology

## 2019-03-18 NOTE — Telephone Encounter (Signed)
New Message    Pt is returning phone call for Tony Silva    Please call back

## 2019-03-18 NOTE — Telephone Encounter (Signed)
   Primary Cardiologist: Candee Furbish, MD  Left voicemail for patient to call back to discuss medical clearance for upcoming spinal surgery.   Abigail Butts, PA-C 03/18/2019, 10:33 AM

## 2019-03-18 NOTE — Telephone Encounter (Signed)
   Primary Cardiologist: Candee Furbish, MD  Chart reviewed as part of pre-operative protocol coverage. Patient was contacted 03/18/2019 in reference to pre-operative risk assessment for pending surgery as outlined below.  Tony Silva was last seen on 03/11/2019 by Dr. Marlou Porch.  Since that day, Tony Silva has done well from a cardiac standpoint. He denies chest pain, SOB, or palpitations. He is limited in mobility by back pain but can easily complete 4 METs without anginal symptoms. He reports his atrial fibrillation is well controlled with prn diltiazem.   Therefore, based on ACC/AHA guidelines, the patient would be at acceptable risk for the planned procedure without further cardiovascular testing.   I will route this recommendation to the requesting party via Epic fax function and remove from pre-op pool.  Please call with questions.  Abigail Butts, PA-C 03/18/2019, 12:24 PM

## 2019-03-25 ENCOUNTER — Other Ambulatory Visit: Payer: Self-pay

## 2019-03-25 ENCOUNTER — Ambulatory Visit (INDEPENDENT_AMBULATORY_CARE_PROVIDER_SITE_OTHER): Payer: Managed Care, Other (non HMO)

## 2019-03-25 DIAGNOSIS — I480 Paroxysmal atrial fibrillation: Secondary | ICD-10-CM

## 2019-03-25 NOTE — Progress Notes (Signed)
Complete echocardiogram has been performed.  Jimmy Arraya Buck RDCS, RVT 

## 2020-03-20 ENCOUNTER — Ambulatory Visit (INDEPENDENT_AMBULATORY_CARE_PROVIDER_SITE_OTHER): Payer: BC Managed Care – PPO | Admitting: Cardiology

## 2020-03-20 ENCOUNTER — Encounter: Payer: Self-pay | Admitting: Cardiology

## 2020-03-20 ENCOUNTER — Other Ambulatory Visit: Payer: Self-pay

## 2020-03-20 VITALS — BP 122/80 | HR 65 | Ht 71.5 in | Wt 196.6 lb

## 2020-03-20 DIAGNOSIS — I440 Atrioventricular block, first degree: Secondary | ICD-10-CM | POA: Diagnosis not present

## 2020-03-20 DIAGNOSIS — I77810 Thoracic aortic ectasia: Secondary | ICD-10-CM | POA: Diagnosis not present

## 2020-03-20 DIAGNOSIS — I4510 Unspecified right bundle-branch block: Secondary | ICD-10-CM

## 2020-03-20 DIAGNOSIS — I480 Paroxysmal atrial fibrillation: Secondary | ICD-10-CM

## 2020-03-20 DIAGNOSIS — E7800 Pure hypercholesterolemia, unspecified: Secondary | ICD-10-CM

## 2020-03-20 NOTE — Patient Instructions (Signed)
Medication Instructions:  The current medical regimen is effective;  continue present plan and medications.  *If you need a refill on your cardiac medications before your next appointment, please call your pharmacy*  Follow-Up: At [facility], you and your health needs are our priority.  As part of our continuing mission to provide you with exceptional heart care, we have created designated Provider Care Teams.  These Care Teams include your primary Cardiologist (physician) and Advanced Practice Providers (APPs -  Physician Assistants and Nurse Practitioners) who all work together to provide you with the care you need, when you need it.  We recommend signing up for the patient portal called "MyChart".  Sign up information is provided on this After Visit Summary.  MyChart is used to connect with patients for Virtual Visits (Telemedicine).  Patients are able to view lab/test results, encounter notes, upcoming appointments, etc.  Non-urgent messages can be sent to your provider as well.   To learn more about what you can do with MyChart, go to [email or web address].    Your next appointment:   12 month(s)  The format for your next appointment:   In Person  Provider:   Mark Skains, MD   Thank you for choosing [facility]!!      

## 2020-03-20 NOTE — Progress Notes (Signed)
Cardiology Office Note:    Date:  03/20/2020   ID:  Dayna Barker, DOB [birth year], MRN [id number]  PCP:  Eulas Post, MD  Cardiologist:  Candee Furbish, MD  Electrophysiologist:  None   Referring MD: Eulas Post, MD     History of Present Illness:    KAYD BEVERIDGE is a 59 y.o. male here for the follow-up of paroxysmal atrial fibrillation, mildly dilated aortic root, right bundle branch block with first degree AV block, hyperlipidemia.  Has previously been seen in the atrial fibrillation clinic back in 2018.  Has taken as needed Cardizem, eventually converted back to sinus rhythm.  Has had rare episodes of A. fib.  Triggers may include stress, occasional wine.  In the past, when he does feel some palpitations or PACs, he will hydrate and take an occasional extra diltiazem.  Longest duration of A. fib may be 2 to 3 hours.  No syncopal episodes.  See below for details.  He has been very stable with 22 episodes per year over the last 3 years.  Retired Radio producer at [employer], [employer].  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  Hopeful to get a catamaran.  Enjoyed sailing the [place].  Past Medical History:  Diagnosis Date  . High cholesterol   . Paroxysmal atrial fibrillation (HCC)   . RBBB (right bundle branch block with left anterior fascicular block) 2016    Past Surgical History:  Procedure Laterality Date  . EYE SURGERY     lasik 2001  . [place]   left  . INGUINAL HERNIA REPAIR Right 06/05/2014   Procedure: OPEN RIGHT INGUINAL HERNIA REPAIR;  Surgeon: Ralene Ok, MD;  Location: [place];  Service: General;  Laterality: Right;  . INSERTION OF MESH Right 06/05/2014   Procedure: INSERTION OF MESH;  Surgeon: Ralene Ok, MD;  Location: [place];  Service: General;  Laterality: Right;    Current Medications: Current Meds  Medication Sig  . CARTIA XT 120 MG 24 hr capsule TAKE 1 CAPSULE BY MOUTH DAILY.   Marland Kitchen diltiazem (CARDIZEM) 30 MG tablet TAKE 1-2 TABLETS BY MOUTH EVERY 4 HOURS AS NEEDED FOR HEART RATE >100 AS LONG AS BLOOD PRESSURE >100.  . fish oil-omega-3 fatty acids 1000 MG capsule Take 2 g by mouth daily.  . fluticasone (FLONASE) 50 MCG/ACT nasal spray USE 2 SPRAYS IN EACH NOSTRIL ONCE DAILY  . MULTIPLE VITAMIN PO Take 1 tablet by mouth daily.   . rosuvastatin (CRESTOR) 5 MG tablet Take 1 tablet (5 mg total) by mouth daily.   Current Facility-Administered Medications for the 03/20/20 encounter (Office Visit) with Jerline Pain, MD  Medication  . 0.9 %  sodium chloride infusion     Allergies:   Codeine   Social History   Socioeconomic History  . Marital status: Married    Spouse name: Not on file  . Number of children: Not on file  . Years of education: Not on file  . Highest education level: Not on file  Occupational History  . Not on file  Tobacco Use  . Smoking status: Never Smoker  . Smokeless tobacco: Never Used  Substance and Sexual Activity  . Alcohol use: Yes    Comment: 6-8 glasses per week  . Drug use: No  . Sexual activity: Not on file  Other Topics Concern  . 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 Difficulty of Paying Living Expenses:   Food Insecurity:   . Worried About Charity fundraiser in the Last Year:   . Arboriculturist in the Last Year:   Transportation Needs:   . Film/video editor (Medical):   Marland Kitchen Lack of Transportation (Non-Medical):   Physical Activity:   . Days of Exercise per Week:   . Minutes of Exercise per Session:   Stress:   . Feeling of Stress :   Social Connections:   . Frequency of Communication with Friends and Family:   . Frequency of Social Gatherings with Friends and Family:   . Attends Religious Services:   . Active Member of Clubs or Organizations:   . Attends Archivist Meetings:   Marland Kitchen Marital  Status:      Family History: The patient's family history includes COPD in his father and mother. There is no history of Esophageal cancer, Colon cancer, or Stomach cancer.  ROS:   Please see the history of present illness.     All other systems reviewed and are negative.  EKGs/Labs/Other Studies Reviewed:    The following studies were reviewed today:  Echocardiogram 07/02/2017: - Left ventricle: Moderate basal septal hypertrophy no SAM. The cavity size was normal. Systolic function was normal. The estimated ejection fraction was in the range of 55% to 60%. Left ventricular diastolic function parameters were normal. - Aortic valve: There was mild regurgitation. - Aorta: Mild dilatation of the sinus and ascending aortic root.26mm - Mitral valve: Flat closure of mitral leaflets with no frank prolapse. There was mild regurgitation. - Left atrium: The atrium was mildly dilated. - Right atrium: The atrium was mildly dilated. - Atrial septum: A patent foramen ovale cannot be excluded.  Echocardiogram 03/25/2019: 1. The left ventricle has normal systolic function with an ejection  fraction of 60-65%. The cavity size was normal. Left ventricular diastolic  Doppler parameters are consistent with impaired relaxation.  2. The right ventricle has normal systolic function. The cavity was  normal. There is no increase in right ventricular wall thickness.  3. Mild aortic regurgitaton.  4. There is mild dilatation of the ascending aorta measuring 41 mm.   Coronary calcium score 06/21/2013: 0, excellent  EKG:  EKG is  ordered today.  The ekg ordered today demonstrates sinus rhythm right bundle branch block 65 PR interval 242 QRS duration 164 ms  Recent Labs: No results found for requested labs within last 8760 hours.  Recent Lipid Panel    Component Value Date/Time   CHOL 213 (H) 04/08/2017 1039   TRIG 98.0 04/08/2017 1039   HDL 62.10 04/08/2017 1039   CHOLHDL 3 04/08/2017  1039   VLDL 19.6 04/08/2017 1039   LDLCALC 131 (H) 04/08/2017 1039    Physical Exam:    VS:  BP 122/80   Pulse 65   Ht 5' 11.5" (1.816 m)   Wt 196 lb 9.6 oz (89.2 kg)   SpO2 97%   BMI 27.04 kg/m     Wt Readings from Last 3 Encounters:  03/20/20 196 lb 9.6 oz (89.2 kg)  03/11/19 192 lb (87.1 kg)  06/05/14 192 lb (87.1 kg)     GEN:  Well nourished, well developed in no acute distress HEENT: Normal NECK: No JVD; No carotid bruits LYMPHATICS: No lymphadenopathy CARDIAC: RRR, no murmurs, rubs, gallops RESPIRATORY:  Clear to auscultation without  rales, wheezing or rhonchi  ABDOMEN: Soft, non-tender, non-distended MUSCULOSKELETAL:  No edema; No deformity  SKIN: Warm and dry NEUROLOGIC:  Alert and oriented x 3 PSYCHIATRIC:  Normal affect   ASSESSMENT:    1. Paroxysmal atrial fibrillation (HCC)   2. First degree AV block   3. Dilated aortic root ([place])   4. RBBB   5. Pure hypercholesterolemia    PLAN:    In order of problems listed above:  Paroxysmal atrial fibrillation -CHA2DS2-VASc 0.  No anticoagulation.  Rare episodes.  Always have ablation if necessary in the future.  As needed diltiazem.  Usually he will hydrate if he begins to feel some palpitations.   -No prior antiarrhythmic drugs. -No prior cardioversions -No prior ablation 22 episodes a year - 15 hours longest past year. Avg 4hrs. Symptomatic.   Prevention -Coronary calcium score 06/21/2013-0, 2019 - [facility].  Excellent.  Had discussion with him about the V in [place].  A very small amount of focal calcium/calcified plaque noted.  Obviously, if there were more significant plaque burden present, I think he would certainly get a point in the risk now.  Hyperlipidemia -Previously on Crestor 5 mg.  Had some myalgias or issues with Lipitor and Zocor in the past.  Prior LDL was 141 in 2019.  Also takes fish oil.  Triglycerides were 250 in the past. LDL 118 2021. Taking Cerstor 5 BID HDL 53. Now daily. Muscle  fatigue on increased dosing.  Discussed that we would optimally like less than 70 with his minimally elevated coronary calcium score of 12.  Right bundle branch block with first-degree AV block -Stable chronic with no syncope.  We will try to avoid antiarrhythmics.  Mildly dilated aortic root -41 mm on echocardiogram in 2018 and in 2020.  We will likely repeat again in 3 years   Medication Adjustments/Labs and Tests Ordered: Current medicines are reviewed at length with the patient today.  Concerns regarding medicines are outlined above.  Orders Placed This Encounter  Procedures  . EKG 12-Lead   No orders of the defined types were placed in this encounter.   Patient Instructions  Medication Instructions:  The current medical regimen is effective;  continue present plan and medications.  *If you need a refill on your cardiac medications before your next appointment, please call your pharmacy*  Follow-Up: At [facility], you and your health needs are our priority.  As part of our continuing mission to provide you with exceptional heart care, we have created designated Provider Care Teams.  These Care Teams include your primary Cardiologist (physician) and Advanced Practice Providers (APPs -  Physician Assistants and Nurse Practitioners) who all work together to provide you with the care you need, when you need it.  We recommend signing up for the patient portal called "MyChart".  Sign up information is provided on this After Visit Summary.  MyChart is used to connect with patients for Virtual Visits (Telemedicine).  Patients are able to view lab/test results, encounter notes, upcoming appointments, etc.  Non-urgent messages can be sent to your provider as well.   To learn more about what you can do with MyChart, go to [email or web address].    Your next appointment:   12 month(s)  The format for your next appointment:   In Person  Provider:   Candee Furbish, MD   Thank you for  choosing [facility]!!        Signed, Candee Furbish, MD  03/20/2020 10:24 AM  [facility]

## 2021-06-10 MED ORDER — DILTIAZEM HCL ER COATED BEADS 120 MG PO CP24: 120.0000 mg | ORAL_CAPSULE | Freq: Every day | ORAL | 0 refills | Status: AC

## 2021-08-07 ENCOUNTER — Ambulatory Visit (INDEPENDENT_AMBULATORY_CARE_PROVIDER_SITE_OTHER): Payer: BC Managed Care – PPO | Admitting: Cardiology

## 2021-08-07 ENCOUNTER — Encounter: Payer: Self-pay | Admitting: Cardiology

## 2021-08-07 ENCOUNTER — Other Ambulatory Visit: Payer: Self-pay

## 2021-08-07 ENCOUNTER — Ambulatory Visit (HOSPITAL_COMMUNITY)
Admission: RE | Admit: 2021-08-07 | Discharge: 2021-08-07 | Disposition: A | Discharge status: Home / Self Care | Payer: BC Managed Care – PPO | Source: Ambulatory Visit | Attending: Cardiology | Admitting: Cardiology

## 2021-08-07 VITALS — BP 144/90 | HR 54 | Ht 71.5 in | Wt 198.0 lb

## 2021-08-07 DIAGNOSIS — I480 Paroxysmal atrial fibrillation: Secondary | ICD-10-CM | POA: Insufficient documentation

## 2021-08-07 DIAGNOSIS — E785 Hyperlipidemia, unspecified: Secondary | ICD-10-CM | POA: Insufficient documentation

## 2021-08-07 DIAGNOSIS — I4510 Unspecified right bundle-branch block: Secondary | ICD-10-CM | POA: Insufficient documentation

## 2021-08-07 DIAGNOSIS — I351 Nonrheumatic aortic (valve) insufficiency: Secondary | ICD-10-CM | POA: Diagnosis not present

## 2021-08-07 DIAGNOSIS — R55 Syncope and collapse: Secondary | ICD-10-CM | POA: Diagnosis not present

## 2021-08-07 DIAGNOSIS — I77810 Thoracic aortic ectasia: Secondary | ICD-10-CM | POA: Diagnosis not present

## 2021-08-07 DIAGNOSIS — I2584 Coronary atherosclerosis due to calcified coronary lesion: Secondary | ICD-10-CM

## 2021-08-07 DIAGNOSIS — E7800 Pure hypercholesterolemia, unspecified: Secondary | ICD-10-CM

## 2021-08-07 DIAGNOSIS — I2510 Atherosclerotic heart disease of native coronary artery without angina pectoris: Secondary | ICD-10-CM

## 2021-08-07 DIAGNOSIS — I440 Atrioventricular block, first degree: Secondary | ICD-10-CM | POA: Insufficient documentation

## 2021-08-07 LAB — ECHOCARDIOGRAM COMPLETE
AR max vel: 3.42 cm2
AV Area VTI: 3.4 cm2
AV Area mean vel: 3.59 cm2
AV Mean grad: 6 mmHg
AV Peak grad: 10.4 mmHg
Ao pk vel: 1.61 m/s
Area-P 1/2: 3.1 cm2
Height: 71.5 in
P 1/2 time: 620 msec
S' Lateral: 2.3 cm
Weight: 3168 oz

## 2021-08-07 NOTE — Assessment & Plan Note (Signed)
Continue encourage Crestor use.  He has been taking his Crestor 5 mg sometimes every other day.  Describes some muscle weakness with it.  I will have him set up with a video visit with Megan Supple in the lipid clinic to discuss potential other options.  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  I would like to see his LDL less than 70 given his coronary artery calcification of his LAD.

## 2021-08-07 NOTE — Patient Instructions (Signed)
Medication Instructions:  The current medical regimen is effective;  continue present plan and medications.  *If you need a refill on your cardiac medications before your next appointment, please call your pharmacy*  Testing/Procedures: Your physician has requested that you have an echocardiogram. Echocardiography is a painless test that uses sound waves to create images of your heart. It provides your doctor with information about the size and shape of your heart and how well your heart's chambers and valves are working. This procedure takes approximately one hour. There are no restrictions for this procedure.  You have been referred to our [facility] with [facility].  This will be a video visit.  Follow-Up: At [facility] ([place] you and your health needs are our priority.  As part of our continuing mission to provide you with exceptional heart care, we have created designated Provider Care Teams.  These Care Teams include your primary Cardiologist (physician) and Advanced Practice Providers (APPs -  Physician Assistants and Nurse Practitioners) who all work together to provide you with the care you need, when you need it.  We recommend signing up for the patient portal called "MyChart".  Sign up information is provided on this After Visit Summary.  MyChart is used to connect with patients for Virtual Visits (Telemedicine).  Patients are able to view lab/test results, encounter notes, upcoming appointments, etc.  Non-urgent messages can be sent to your provider as well.   To learn more about what you can do with MyChart, go to [email or web address].    Your next appointment:   1 year(s)  The format for your next appointment:   In Person  Provider:   Candee Furbish, MD   Thank you for choosing [facility]!!

## 2021-08-07 NOTE — Assessment & Plan Note (Signed)
Originally coronary calcium score in 2014 was 0.  Upon repeat in 2019 LDL showed calcification, score of 17.  Encouraged use of Crestor.  Has had some muscle weakness with this.  We will have him talk with the lipid clinic via video visit for further discussion.

## 2021-08-07 NOTE — Assessment & Plan Note (Signed)
Noted vagal reaction during leg presses when working out at 1 point.  This is not common for him.  Continue to monitor.

## 2021-08-07 NOTE — Assessment & Plan Note (Signed)
Stable, no changes  

## 2021-08-07 NOTE — Assessment & Plan Note (Signed)
41 mm on prior echocardiogram.  Mild aortic regurgitation.  We will repeat echocardiogram.

## 2021-08-07 NOTE — Assessment & Plan Note (Signed)
Mild, PR interval 234 ms with right bundle branch block.  Heart rate 54 bpm.  On low-dose diltiazem.

## 2021-08-07 NOTE — Assessment & Plan Note (Signed)
Will occasionally have an episode of atrial fibrillation, quite rare, short-lived.  Continue with diltiazem CD1 20 mg a day, has short acting Cardizem to take if necessary.  CHA2DS2-VASc is 1.  He is not on anticoagulation.  He has a score of 1 for coronary artery calcification.

## 2021-08-07 NOTE — Progress Notes (Signed)
Cardiology Office Note:    Date:  08/07/2021   ID:  Tony Silva, DOB [birth year], MRN [id number]  PCP:  Coral Else, PA   [facility] Providers Cardiologist:  Candee Furbish, MD    Referring MD: Eulas Post, MD     History of Present Illness:    Tony Silva is a 60 y.o. 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  Has previously been seen in the atrial fibrillation clinic back in 2018.  Has taken as needed Cardizem, eventually converted back to sinus rhythm.  Has had rare episodes of A. fib.  Triggers may include stress, occasional wine.   In the past, when he does feel some palpitations or PACs, he will hydrate and take an occasional extra diltiazem.  Longest duration of A. fib may be 2 to 3 hours.  No syncopal episodes.   See below for details.  He has been very stable with 22 episodes per year over the last 3 years.   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   Enjoyed sailing the [place].  Now has a catamaran.  They are going to go for an extended period of time to [place].  Leg press, vagal response 20 min.   Crestor 5 QOD  Past Medical History:  Diagnosis Date   High cholesterol    Paroxysmal atrial fibrillation (HCC)    RBBB (right bundle branch block with left anterior fascicular block) 2016    Past Surgical History:  Procedure Laterality Date   EYE SURGERY     lasik 2001   HERNIA REPAIR  1979   left   INGUINAL HERNIA REPAIR Right 06/05/2014   Procedure: OPEN RIGHT INGUINAL HERNIA REPAIR;  Surgeon: Ralene Ok, MD;  Location: [facility];  Service: General;  Laterality: Right;   INSERTION OF MESH Right 06/05/2014   Procedure: INSERTION OF MESH;  Surgeon: Ralene Ok, MD;  Location: [facility];  Service: General;  Laterality: Right;    Current Medications: Current Meds   Medication Sig   diltiazem (CARDIZEM) 30 MG tablet TAKE 1-2 TABLETS BY MOUTH EVERY 4 HOURS AS NEEDED FOR HEART RATE >100 AS LONG AS BLOOD PRESSURE >100.   diltiazem (CARTIA XT) 120 MG 24 hr capsule Take 1 capsule (120 mg total) by mouth daily.   fish oil-omega-3 fatty acids 1000 MG capsule Take 2 g by mouth daily.   fluticasone (FLONASE) 50 MCG/ACT nasal spray USE 2 SPRAYS IN EACH NOSTRIL ONCE DAILY   MULTIPLE VITAMIN PO Take 1 tablet by mouth daily.    rosuvastatin (CRESTOR) 5 MG tablet Take 1 tablet (5 mg total) by mouth daily.   Current Facility-Administered Medications for the 08/07/21 encounter (Office Visit) with Jerline Pain, MD  Medication   0.9 %  sodium chloride infusion     Allergies:   Codeine   Social History   Socioeconomic History   Marital status: Married    Spouse name: Not on file   Number of children: Not on file   Years of education: Not on file   Highest education level: Not on file  Occupational History   Not on file  Tobacco Use   Smoking status: Never   Smokeless tobacco: Never  Substance and Sexual Activity   Alcohol use: Yes    Comment: 6-8 glasses per week   Drug use: No  Sexual activity: Not on file  Other Topics Concern   Not on file  Social History Narrative   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 There is no history of Esophageal cancer, Colon cancer, or Stomach cancer.  ROS:   Please see the history of present illness.     All other systems reviewed and are negative.  EKGs/Labs/Other Studies Reviewed:    The following studies were reviewed today: Echocardiogram 03/25/2019:  1. The left ventricle has normal  systolic function with an ejection  fraction of 60-65%. The cavity size was normal. Left ventricular diastolic  Doppler parameters are consistent with impaired relaxation.   2. The right ventricle has normal systolic function. The cavity was  normal. There is no increase in right ventricular wall thickness.   3. Mild aortic regurgitaton.   4. There is mild dilatation of the ascending aorta measuring 41 mm.    Coronary calcium score 06/21/2013: 0   10/21/18: LAD calcified plaque, coronary calcium score 17.   EKG:  EKG is  ordered today.  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  Recent Lipid Panel    Component Value Date/Time   CHOL 213 (H) 04/08/2017 1039   TRIG 98.0 04/08/2017 1039   HDL 62.10 04/08/2017 1039   CHOLHDL 3 04/08/2017 1039   VLDL 19.6 04/08/2017 1039   LDLCALC 131 (H) 04/08/2017 1039     Risk Assessment/Calculations:    CHA2DS2-VASc Score = 1   This indicates a 0.6% annual risk of stroke. The patient's score is based upon: CHF History: 0 HTN History: 0 Diabetes History: 0 Stroke History: 0 Vascular Disease History: 1 Age Score: 0 Gender Score: 0         Physical Exam:    VS:  BP (!) 144/90 (BP Location: Left Arm, Patient Position: Sitting, Cuff Size: Normal)   Pulse (!) 54   Ht 5' 11.5" (1.816 m)   Wt 198 lb (89.8 kg)   SpO2 96%   BMI 27.23 kg/m     Wt Readings from Last 3 Encounters:  08/07/21 198 lb (89.8 kg)  03/20/20 196 lb 9.6 oz (89.2 kg)  03/11/19 192 lb (87.1 kg)     GEN:  Well nourished, well developed in no acute distress HEENT: Normal NECK: No JVD; No carotid bruits LYMPHATICS: No lymphadenopathy CARDIAC: RRR, no murmurs, rubs, gallops RESPIRATORY:  Clear to auscultation without rales, wheezing or rhonchi  ABDOMEN: Soft, non-tender, non-distended MUSCULOSKELETAL:  No edema; No deformity   SKIN: Warm and dry NEUROLOGIC:  Alert and oriented x 3 PSYCHIATRIC:  Normal affect   ASSESSMENT:    1. Dilated aortic root (HCC)   2. Paroxysmal atrial fibrillation (HCC)   3. Pure hypercholesterolemia   4. Vagal reaction   5. Right bundle branch block   6. First degree AV block   7. Coronary artery calcification    PLAN:    In order of problems listed above:  Paroxysmal atrial fibrillation ([place]) Will occasionally have an episode of atrial fibrillation,  quite rare, short-lived.  Continue with diltiazem CD1 20 mg a day, has short acting Cardizem to take if necessary.  CHA2DS2-VASc is 1.  He is not on anticoagulation.  He has a score of 1 for coronary artery calcification.  Pure hypercholesterolemia Continue encourage Crestor use.  He has been taking his Crestor 5 mg sometimes every other day.  Describes some muscle weakness with it.  I will have him set up with a video visit with Megan Supple in the lipid clinic to discuss potential other options.  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  I would like to see his LDL less than 70 given his coronary artery calcification of his LAD.  Dilated aortic root (HCC) 41 mm on prior echocardiogram.  Mild aortic regurgitation.  We will repeat echocardiogram.  Vagal reaction Noted vagal reaction during leg presses when working out at 1 point.  This is not common for him.  Continue to monitor.  Right bundle branch block Stable, no changes.  First degree AV block Mild, PR interval 234 ms with right bundle branch block.  Heart rate 54 bpm.  On low-dose diltiazem.  Coronary artery calcification Originally coronary calcium score in 2014 was 0.  Upon repeat in 2019 LDL showed calcification, score of 17.  Encouraged use of Crestor.  Has had some muscle weakness with this.  We will have him talk with the lipid clinic via video visit for further discussion.       Medication Adjustments/Labs and Tests  Ordered: Current medicines are reviewed at length with the patient today.  Concerns regarding medicines are outlined above.  Orders Placed This Encounter  Procedures   AMB Referral to [facility]   EKG 12-Lead   ECHOCARDIOGRAM COMPLETE    No orders of the defined types were placed in this encounter.   Patient Instructions  Medication Instructions:  The current medical regimen is effective;  continue present plan and medications.  *If you need a refill on your cardiac medications before your next appointment, please call your pharmacy*  Testing/Procedures: Your physician has requested that you have an echocardiogram. Echocardiography is a painless test that uses sound waves to create images of your heart. It provides your doctor with information about the size and shape of your heart and how well your heart's chambers and valves are working. This procedure takes approximately one hour. There are no restrictions for this procedure.  You have been referred to our [facility] with [facility].  This will be a video visit.  Follow-Up: At [facility], you and your health needs are our priority.  As part of our continuing mission to provide you with exceptional heart care, we have created designated Provider Care Teams.  These Care Teams include your primary Cardiologist (physician) and Advanced Practice Providers (APPs -  Physician Assistants and Nurse Practitioners) who all work together to provide you with the care you need, when you need it.  We recommend signing up for the patient portal called "MyChart".  Sign up information is provided on this After Visit Summary.  MyChart is used to connect with patients for Virtual Visits (Telemedicine).  Patients are able to view lab/test results, encounter notes, upcoming appointments, etc.  Non-urgent messages can be sent to your provider as well.   To learn more about what you can do with MyChart, go to [email or web address].     Your next appointment:   1 year(s)  The format for your next appointment:  In Person  Provider:   Candee Furbish, MD   Thank you for choosing [facility]!!     Signed, Candee Furbish, MD  08/07/2021 11:56 AM    [place]

## 2021-08-12 ENCOUNTER — Other Ambulatory Visit (HOSPITAL_COMMUNITY): Payer: BC Managed Care – PPO

## 2021-08-13 NOTE — Progress Notes (Signed)
Patient ID: Tony Silva                 DOB: [birth year]                    MRN: [id number]     HPI: Tony Silva is a 60 y.o. male patient referred to lipid clinic by Dr. Marlou Porch. PMH is significant for HLD, afib, CAD with coronary artery calcium score of 12 in the LAD in 2019 placing him in the 30th percentile for age/sex matched control. Previous calcium score in 2014 was 0. Echo on 08/08/21 showed EF of 60-65% with grade 1 diastolic dysfunction and mild dilated ascending aorta to 41 mm. Pt last seen by Dr. Marlou Porch 08/07/21. He has experienced myalgias with statin therapy and was referred to lipid clinic to discuss other options.  Today's visit conducted via telephone due to lack of video capability on desktop. Pt excited for upcoming international vacation in a few days. He has been taking rosuvastatin 5 mg daily since office visit with Dr. 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 Previously tolerated rosuvastatin 10mg  once weekly dosing a few years again and 5mg  on alternate dosing schedule in the past but has since resumed daily dosing to further reduce his LDL. Not interested in injectable medications secondary to busy travel schedule, he has moved to [place].  Current Medications: rosuvastatin 5 mg every other day, fish oil 2g daily Intolerances: rosuvastatin 10 mg weekly  Risk Factors: elevated calcium score LDL goal: LDL 70mg /dL  Exercise: Patient actively sails and works on his boat  Family History: family history includes COPD in his father and mother.  Social History:  reports that he has never smoked. He has never used smokeless tobacco. He reports current alcohol use. He reports that he does not use drugs.  Labs: 03/2021: TC 206 TG 122 HDL 59 LDL 125 (rosuvastatin 5 mg MWF)  02/2018: TC 250 TG 142 HDL 61 LDL 161  Past Medical History:  Diagnosis Date   High cholesterol    Paroxysmal atrial fibrillation (HCC)    RBBB (right bundle branch block  with left anterior fascicular block) 2016    Current Outpatient Medications on File Prior to Visit  Medication Sig Dispense Refill   diltiazem (CARDIZEM) 30 MG tablet TAKE 1-2 TABLETS BY MOUTH EVERY 4 HOURS AS NEEDED FOR HEART RATE >100 AS LONG AS BLOOD PRESSURE >100. 45 tablet 3   diltiazem (CARTIA XT) 120 MG 24 hr capsule Take 1 capsule (120 mg total) by mouth daily. 90 capsule 0   fish oil-omega-3 fatty acids 1000 MG capsule Take 2 g by mouth daily.     fluticasone (FLONASE) 50 MCG/ACT nasal spray USE 2 SPRAYS IN EACH NOSTRIL ONCE DAILY 48 g 3   MULTIPLE VITAMIN PO Take 1 tablet by mouth daily.      rosuvastatin (CRESTOR) 5 MG tablet Take 1 tablet (5 mg total) by mouth daily. 90 tablet 3   Current Facility-Administered Medications on File Prior to Visit  Medication Dose Route Frequency Provider Last Rate Last Admin   0.9 %  sodium chloride infusion  500 mL Intravenous Continuous Armbruster, Carlota Raspberry, MD        Allergies  Allergen Reactions   Codeine Nausea Only    Assessment/Plan:  1. Hyperlipidemia - LDL above goal 70mg /dL given elevated coronary calcium score. He wishes to continue with higher frequency of rosuvastatin 5mg  daily dosing to see if he tolerates  well. If not, can decrease back to every other day or MWF dosing which he has tolerated in the past. Discussed ezetimibe, Nexlizet, PCSK9i and Vascepa today. He prefers to start ezetimibe 10 mg daily. Injectable therapy is difficult with his travel schedule and Vascepa is not covered by his insurance since his TG are < 150. 61-month supply of Zetia sent to pharmacy given pt's upcoming extending international travel (pharmacy ran 3 months through his insurance and 3 months through [employer]). Will have updated labs checked next June, can consider Nexlizet if needed.   Rylin Seavey E. Shandee Jergens, PharmD, BCACP, [place] [address], [place], [place] [address] Phone: [phone]; Fax: [phone] 08/14/2021  10:14 AM

## 2021-08-14 ENCOUNTER — Ambulatory Visit (INDEPENDENT_AMBULATORY_CARE_PROVIDER_SITE_OTHER): Payer: BC Managed Care – PPO | Admitting: Pharmacist

## 2021-08-14 ENCOUNTER — Other Ambulatory Visit: Payer: Self-pay

## 2021-08-14 DIAGNOSIS — E7800 Pure hypercholesterolemia, unspecified: Secondary | ICD-10-CM

## 2021-08-14 DIAGNOSIS — I2510 Atherosclerotic heart disease of native coronary artery without angina pectoris: Secondary | ICD-10-CM

## 2021-08-14 DIAGNOSIS — I2584 Coronary atherosclerosis due to calcified coronary lesion: Secondary | ICD-10-CM

## 2021-08-14 MED ORDER — EZETIMIBE 10 MG PO TABS: 10.0000 mg | ORAL_TABLET | Freq: Every day | ORAL | 1 refills | Status: DC

## 2023-06-26 ENCOUNTER — Other Ambulatory Visit: Payer: Self-pay

## 2023-06-26 MED ORDER — EZETIMIBE 10 MG PO TABS: 10.0000 mg | ORAL_TABLET | Freq: Every day | ORAL | 0 refills | Status: DC

## 2023-10-11 ENCOUNTER — Other Ambulatory Visit: Payer: Self-pay | Admitting: Cardiology

## 2024-06-26 ENCOUNTER — Encounter: Payer: Self-pay | Admitting: Cardiology

## 2024-06-26 DIAGNOSIS — I480 Paroxysmal atrial fibrillation: Secondary | ICD-10-CM

## 2024-09-05 ENCOUNTER — Encounter: Payer: Self-pay | Admitting: Cardiology

## 2024-09-05 ENCOUNTER — Other Ambulatory Visit (HOSPITAL_COMMUNITY): Payer: Self-pay

## 2024-09-05 ENCOUNTER — Ambulatory Visit: Payer: Self-pay | Attending: Cardiology | Admitting: Cardiology

## 2024-09-05 VITALS — BP 134/86 | HR 61 | Ht 71.5 in | Wt 196.0 lb

## 2024-09-05 DIAGNOSIS — I440 Atrioventricular block, first degree: Secondary | ICD-10-CM

## 2024-09-05 DIAGNOSIS — I480 Paroxysmal atrial fibrillation: Secondary | ICD-10-CM

## 2024-09-05 DIAGNOSIS — I4510 Unspecified right bundle-branch block: Secondary | ICD-10-CM

## 2024-09-05 MED ORDER — APIXABAN 5 MG PO TABS
5.0000 mg | ORAL_TABLET | Freq: Two times a day (BID) | ORAL | 11 refills | Status: DC
  Filled 2024-09-05: qty 60, 30d supply, fill #0

## 2024-09-05 NOTE — Addendum Note (Signed)
 Addended by: CHAUVIGNE, Zeniyah Peaster on: 09/05/2024 11:25 AM   Modules accepted: Orders

## 2024-09-05 NOTE — Progress Notes (Signed)
 Electrophysiology Office Note:   Date:  09/05/2024  ID:  RAKIN LEMELLE, DOB 03-04-1961, MRN [id number]  Primary Cardiologist: Oneil Parchment, MD Primary Heart Failure: None Electrophysiologist: Okema Rollinson Gladis Norton, MD      History of Present Illness:   Tony Silva is a 63 y.o. male with h/o atrial fibrillation, dilated aortic root, right bundle branch block, first-degree AV block, hyperlipidemia seen today for  for Electrophysiology evaluation of atrial fibrillation at the request of Oneil Parchment.    He has had atrial fibrillation since at least 2018.  He has been on as needed diltiazem .  He in the past, he has noted PACs.  He thinks atrial fibrillation episodes of lasted between 2 to 3 hours.  He is having episodes now approximately once a week.  They have occurred when he is on vacation, hiking at an [facility] and as well as hiking in [place].  He would prefer rhythm control.  He would like to avoid long-term antiarrhythmics.  Review of systems complete and found to be negative unless listed in HPI.   EP Information / Studies Reviewed:    EKG is ordered today. Personal review as below.  EKG Interpretation Date/Time:  Monday September 05 2024 10:54:38 EST Ventricular Rate:  61 PR Interval:  242 QRS Duration:  160 QT Interval:  448 QTC Calculation: 450 R Axis:   -17  Text Interpretation: Sinus rhythm with 1st degree A-V block Right bundle branch block When compared with ECG of 03-Dec-2016 13:01, Left anterior fascicular block is no longer Present Minimal criteria for Inferior infarct are no longer Present Confirmed by Latissa Frick ([id number]) on 09/05/2024 11:10:45 AM     Risk Assessment/Calculations:    CHA2DS2-VASc Score = 1   This indicates a 0.6% annual risk of stroke. The patient's score is based upon: CHF History: 0 HTN History: 0 Diabetes History: 0 Stroke History: 0 Vascular Disease History: 0 Age Score: 1 Gender Score: 0             Physical Exam:   VS:  BP  134/86 (BP Location: Left Arm, Patient Position: Sitting, Cuff Size: Large)   Pulse 61   Ht 5' 11.5 (1.816 m)   Wt 196 lb (88.9 kg)   SpO2 97%   BMI 26.96 kg/m    Wt Readings from Last 3 Encounters:  09/05/24 196 lb (88.9 kg)  08/07/21 198 lb (89.8 kg)  03/20/20 196 lb 9.6 oz (89.2 kg)     GEN: Well nourished, well developed in no acute distress NECK: No JVD; No carotid bruits CARDIAC: Regular rate and rhythm, no murmurs, rubs, gallops RESPIRATORY:  Clear to auscultation without rales, wheezing or rhonchi  ABDOMEN: Soft, non-tender, non-distended EXTREMITIES:  No edema; No deformity   ASSESSMENT AND PLAN:    1.  Paroxysmal atrial fibrillation: On diltiazem .  Not anticoagulated due to low stroke risk.  He has episodes of atrial fibrillation on a weekly basis.  Usually he can hydrate and take Cardizem  which improves his symptoms.  Despite this, he would prefer rhythm control.  Due to his right bundle branch block and first-degree AV block, we Milus Fritze hold off on antiarrhythmic medications.  Danni Leabo plan for ablation.  Risk benefits have been discussed.  He understands the risks and is agreed to the procedure.  Risk, benefits, and alternatives to EP study and radiofrequency/pulse field ablation for afib were also discussed in detail today. These risks include but are not limited to stroke, bleeding, vascular damage, tamponade, perforation,  damage to the esophagus, lungs, and other structures, pulmonary vein stenosis, worsening renal function, and death. The patient understands these risk and wishes to proceed.  We Teleah Villamar therefore proceed with catheter ablation at the next available time.  Carto, ICE, anesthesia are requested for the procedure.  This patient Liddy Deam NOT require CT prior to ablation  2.  Hyperlipidemia: Continue Crestor  per primary care  3.  Dilated aortic root: 41 mm on prior echo.  Plan per primary cardiology.  4.  Bifascicular block.  No evidence of syncope.  Continue current  management.   Follow up with EP Team as usual post procedure  Signed, Vamsi Apfel Gladis Norton, MD

## 2024-09-05 NOTE — Patient Instructions (Addendum)
 Medication Instructions:  Your physician has recommended you make the following change in your medication:  1) START taking Eliquis 5 mg twice daily   *If you need a refill on your cardiac medications before your next appointment, please call your pharmacy*  Testing/Procedures: Ablation Your physician has recommended that you have an ablation. Catheter ablation is a medical procedure used to treat some cardiac arrhythmias (irregular heartbeats). During catheter ablation, a long, thin, flexible tube is put into a blood vessel in your groin (upper thigh), or neck. This tube is called an ablation catheter. It is then guided to your heart through the blood vessel. Radio frequency waves destroy small areas of heart tissue where abnormal heartbeats may cause an arrhythmia to start.   You are scheduled for Atrial Fibrillation Ablation on Friday, January 16th  with Dr. Dr. Inocencio. Please arrive at the Main Entrance A at [facility]: [address] [place], [place] [address] at 7:30am   What To Expect:  Labs: you will need to have lab work drawn within 30 days of your procedure. Please go to any [facility] location to have these drawn - no appointment is needed. You will receive procedure instructions either through MyChart or in the mail 4-6 week prior to your procedure.  After your procedure we recommend no driving for 4 days, no lifting over 5 lbs for 7 days, and no work or strenuous activity for 7 days.  Please contact our office at [phone] if you have any questions.  Follow-Up: We will contact you to schedule your post-procedure appointments.

## 2024-09-28 ENCOUNTER — Encounter: Payer: Self-pay | Admitting: Cardiology

## 2024-09-29 MED ORDER — APIXABAN 5 MG PO TABS: 5.0000 mg | ORAL_TABLET | Freq: Two times a day (BID) | ORAL | 3 refills | Status: AC

## 2024-10-05 ENCOUNTER — Telehealth: Payer: Self-pay

## 2024-10-05 NOTE — Telephone Encounter (Signed)
-----   Message from Nurse Carlyle C sent at 09/05/2024 11:30 AM EST ----- Regarding: 11/11/24 afib ablation  Precert:  MD: Camnitz Type of ablation: A-fib Diagnosis: afib CPT code: A-fib (06343) Ablation scheduled (date/time): 11/11/24 at 9:30am  Procedure:  Added to calendar? Yes Orders entered? Yes Letter complete? No, >30 days before procedure Scheduled with cath lab? Yes Any medications to hold? No Labs ordered (CBC, BMET, PT/INR if on warfarin): Yes Mapping system: OPAL (lab 6 only) CARTO/OPAL rep notified? No Cardiac CT needed? No Dye allergy? No Pre-meds ordered and instructions given? No, not needed Letter method: MyChart H&P: 11/10 Device: No  Follow-up:  Cassie/Angel, please schedule Routine.  Covering RN - please send this message to [employer], EP scheduler, EP Scheduling pool, EP [employer], and CT scheduler (Brittany Lynch/Stephanie Mogg), if indicated.

## 2024-10-17 ENCOUNTER — Other Ambulatory Visit: Payer: Self-pay

## 2024-10-17 DIAGNOSIS — I440 Atrioventricular block, first degree: Secondary | ICD-10-CM

## 2024-10-17 DIAGNOSIS — I4510 Unspecified right bundle-branch block: Secondary | ICD-10-CM

## 2024-10-17 DIAGNOSIS — I480 Paroxysmal atrial fibrillation: Secondary | ICD-10-CM

## 2024-10-18 LAB — CBC
Hematocrit: 46.3 % (ref 37.5–51.0)
Hemoglobin: 15.2 g/dL (ref 13.0–17.7)
MCH: 32.3 pg (ref 26.6–33.0)
MCHC: 32.8 g/dL (ref 31.5–35.7)
MCV: 99 fL — ABNORMAL HIGH (ref 79–97)
Platelets: 226 x10E3/uL (ref 150–450)
RBC: 4.7 x10E6/uL (ref 4.14–5.80)
RDW: 12.1 % (ref 11.6–15.4)
WBC: 4.9 x10E3/uL (ref 3.4–10.8)

## 2024-10-18 LAB — BASIC METABOLIC PANEL WITH GFR
BUN/Creatinine Ratio: 14 (ref 10–24)
BUN: 14 mg/dL (ref 8–27)
CO2: 24 mmol/L (ref 20–29)
Calcium: 9.6 mg/dL (ref 8.6–10.2)
Chloride: 102 mmol/L (ref 96–106)
Creatinine, Ser: 1.03 mg/dL (ref 0.76–1.27)
Glucose: 84 mg/dL (ref 70–99)
Potassium: 4.2 mmol/L (ref 3.5–5.2)
Sodium: 140 mmol/L (ref 134–144)
eGFR: 81 mL/min/1.73

## 2024-10-26 ENCOUNTER — Telehealth (HOSPITAL_COMMUNITY): Payer: Self-pay

## 2024-10-26 ENCOUNTER — Encounter (HOSPITAL_COMMUNITY): Payer: Self-pay

## 2024-10-26 NOTE — Telephone Encounter (Signed)
 Attempted to reach patient to discuss upcoming procedure, no answer. Voicemail not setup.

## 2024-11-10 NOTE — Pre-Procedure Instructions (Signed)
 Instructed patient on the following items: Arrival time 0730 Nothing to eat or drink after midnight No meds AM of procedure Responsible person to drive you home and stay with you for 24 hrs  Have you missed any doses of anti-coagulant Eliquis - takes twice a day, hasn't missed any doses in last 4 weeks.  Don't take dose morning of procedure.

## 2024-11-11 ENCOUNTER — Other Ambulatory Visit: Payer: Self-pay

## 2024-11-11 ENCOUNTER — Ambulatory Visit (HOSPITAL_COMMUNITY): Admitting: Anesthesiology

## 2024-11-11 ENCOUNTER — Ambulatory Visit (HOSPITAL_COMMUNITY)
Admission: RE | Admit: 2024-11-11 | Discharge: 2024-11-11 | Disposition: A | Discharge status: Home / Self Care | Attending: Cardiology | Admitting: Cardiology

## 2024-11-11 ENCOUNTER — Encounter (HOSPITAL_COMMUNITY): Admission: RE | Disposition: A | Payer: Self-pay | Source: Home / Self Care | Attending: Cardiology

## 2024-11-11 DIAGNOSIS — I4510 Unspecified right bundle-branch block: Secondary | ICD-10-CM | POA: Insufficient documentation

## 2024-11-11 DIAGNOSIS — I2584 Coronary atherosclerosis due to calcified coronary lesion: Secondary | ICD-10-CM | POA: Diagnosis not present

## 2024-11-11 DIAGNOSIS — E7800 Pure hypercholesterolemia, unspecified: Secondary | ICD-10-CM | POA: Diagnosis not present

## 2024-11-11 DIAGNOSIS — I440 Atrioventricular block, first degree: Secondary | ICD-10-CM | POA: Insufficient documentation

## 2024-11-11 DIAGNOSIS — Z79899 Other long term (current) drug therapy: Secondary | ICD-10-CM | POA: Insufficient documentation

## 2024-11-11 DIAGNOSIS — E785 Hyperlipidemia, unspecified: Secondary | ICD-10-CM | POA: Insufficient documentation

## 2024-11-11 DIAGNOSIS — I480 Paroxysmal atrial fibrillation: Secondary | ICD-10-CM | POA: Diagnosis not present

## 2024-11-11 DIAGNOSIS — I4891 Unspecified atrial fibrillation: Secondary | ICD-10-CM | POA: Diagnosis not present

## 2024-11-11 HISTORY — PX: ATRIAL FIBRILLATION ABLATION: EP1191

## 2024-11-11 MED ORDER — SODIUM CHLORIDE 0.9 % IV SOLN: INTRAVENOUS | Status: DC

## 2024-11-11 MED ORDER — DEXAMETHASONE SOD PHOSPHATE PF 10 MG/ML IJ SOLN
INTRAMUSCULAR | Status: DC | PRN
  Administered 2024-11-11: 10 mg via INTRAVENOUS

## 2024-11-11 MED ORDER — SODIUM CHLORIDE 0.9 % IV SOLN: 250.0000 mL | INTRAVENOUS | Status: DC | PRN

## 2024-11-11 MED ORDER — MIDAZOLAM HCL (PF) 2 MG/2ML IJ SOLN
INTRAMUSCULAR | Status: DC | PRN
  Administered 2024-11-11: 2 mg via INTRAVENOUS

## 2024-11-11 MED ORDER — ROCURONIUM BROMIDE 10 MG/ML (PF) SYRINGE
PREFILLED_SYRINGE | INTRAVENOUS | Status: DC | PRN
  Administered 2024-11-11: 60 mg via INTRAVENOUS

## 2024-11-11 MED ORDER — MIDAZOLAM HCL 2 MG/2ML IJ SOLN
INTRAMUSCULAR | Status: AC
  Filled 2024-11-11: qty 2

## 2024-11-11 MED ORDER — PROPOFOL 10 MG/ML IV BOLUS
INTRAVENOUS | Status: DC | PRN
  Administered 2024-11-11: 130 mg via INTRAVENOUS

## 2024-11-11 MED ORDER — ACETAMINOPHEN 325 MG PO TABS: 650.0000 mg | ORAL_TABLET | ORAL | Status: DC | PRN

## 2024-11-11 MED ORDER — SUGAMMADEX SODIUM 200 MG/2ML IV SOLN
INTRAVENOUS | Status: DC | PRN
  Administered 2024-11-11: 200 mg via INTRAVENOUS

## 2024-11-11 MED ORDER — ONDANSETRON HCL 4 MG/2ML IJ SOLN
INTRAMUSCULAR | Status: DC | PRN
  Administered 2024-11-11: 4 mg via INTRAVENOUS

## 2024-11-11 MED ORDER — PHENYLEPHRINE 80 MCG/ML (10ML) SYRINGE FOR IV PUSH (FOR BLOOD PRESSURE SUPPORT)
PREFILLED_SYRINGE | INTRAVENOUS | Status: DC | PRN
  Administered 2024-11-11: 160 ug via INTRAVENOUS

## 2024-11-11 MED ORDER — SODIUM CHLORIDE 0.9% FLUSH: 3.0000 mL | Freq: Two times a day (BID) | INTRAVENOUS | Status: DC

## 2024-11-11 MED ORDER — PHENYLEPHRINE HCL-NACL 20-0.9 MG/250ML-% IV SOLN
INTRAVENOUS | Status: DC | PRN
  Administered 2024-11-11: 30 ug/min via INTRAVENOUS

## 2024-11-11 MED ORDER — SODIUM CHLORIDE 0.9% FLUSH: 3.0000 mL | INTRAVENOUS | Status: DC | PRN

## 2024-11-11 MED ORDER — ATROPINE SULFATE 1 MG/10ML IJ SOSY
PREFILLED_SYRINGE | INTRAMUSCULAR | Status: DC | PRN
  Administered 2024-11-11: 1 mg via INTRAVENOUS

## 2024-11-11 MED ORDER — ONDANSETRON HCL 4 MG/2ML IJ SOLN: 4.0000 mg | Freq: Four times a day (QID) | INTRAMUSCULAR | Status: DC | PRN

## 2024-11-11 MED ORDER — HEPARIN SODIUM (PORCINE) 1000 UNIT/ML IJ SOLN
INTRAMUSCULAR | Status: DC | PRN
  Administered 2024-11-11: 15000 [IU] via INTRAVENOUS
  Administered 2024-11-11: 7000 [IU] via INTRAVENOUS

## 2024-11-11 MED ORDER — PROTAMINE SULFATE 10 MG/ML IV SOLN
INTRAVENOUS | Status: DC | PRN
  Administered 2024-11-11: 40 mg via INTRAVENOUS

## 2024-11-11 MED ORDER — FENTANYL CITRATE (PF) 100 MCG/2ML IJ SOLN
INTRAMUSCULAR | Status: AC
  Filled 2024-11-11: qty 2

## 2024-11-11 MED ORDER — LIDOCAINE 2% (20 MG/ML) 5 ML SYRINGE
INTRAMUSCULAR | Status: DC | PRN
  Administered 2024-11-11: 80 mg via INTRAVENOUS

## 2024-11-11 MED ORDER — HEPARIN (PORCINE) IN NACL 1000-0.9 UT/500ML-% IV SOLN
INTRAVENOUS | Status: DC | PRN
  Administered 2024-11-11 (×3): 500 mL

## 2024-11-11 MED ORDER — FENTANYL CITRATE (PF) 250 MCG/5ML IJ SOLN
INTRAMUSCULAR | Status: DC | PRN
  Administered 2024-11-11 (×2): 50 ug via INTRAVENOUS

## 2024-11-11 NOTE — Anesthesia Preprocedure Evaluation (Signed)
"                                    Anesthesia Evaluation  Patient identified by MRN, date of birth, ID band Patient awake    Reviewed: Allergy & Precautions, H&P , NPO status , Patient's Chart, lab work & pertinent test results  Airway Mallampati: II  TM Distance: >3 FB Neck ROM: Full    Dental  (+) Dental Advisory Given   Pulmonary neg pulmonary ROS   Pulmonary exam normal breath sounds clear to auscultation       Cardiovascular Normal cardiovascular exam+ dysrhythmias Atrial Fibrillation  Rhythm:Regular Rate:Normal     Neuro/Psych negative neurological ROS  negative psych ROS   GI/Hepatic negative GI ROS, Neg liver ROS,,,  Endo/Other  negative endocrine ROS    Renal/GU negative Renal ROS  negative genitourinary   Musculoskeletal negative musculoskeletal ROS (+)    Abdominal   Peds negative pediatric ROS (+)  Hematology negative hematology ROS (+)   Anesthesia Other Findings   Reproductive/Obstetrics negative OB ROS                              Anesthesia Physical Anesthesia Plan  ASA: 3  Anesthesia Plan: General   Post-op Pain Management:    Induction: Intravenous  PONV Risk Score and Plan: 2 and Ondansetron , Midazolam  and Treatment may vary due to age or medical condition  Airway Management Planned: Oral ETT  Additional Equipment:   Intra-op Plan:   Post-operative Plan: Extubation in OR  Informed Consent: I have reviewed the patients History and Physical, chart, labs and discussed the procedure including the risks, benefits and alternatives for the proposed anesthesia with the patient or authorized representative who has indicated his/her understanding and acceptance.     Dental advisory given  Plan Discussed with: CRNA  Anesthesia Plan Comments:         Anesthesia Quick Evaluation  "

## 2024-11-11 NOTE — H&P (Signed)
" °  Electrophysiology Office Note:   Date:  11/11/2024  ID:  Tony Silva, DOB Jul 07, 1961, MRN [id number]  Primary Cardiologist: Oneil Parchment, MD Primary Heart Failure: None Electrophysiologist: Aiman Noe Gladis Norton, MD      History of Present Illness:   Tony Silva is a 64 y.o. male with h/o atrial fibrillation, dilated aortic root, right bundle branch block, first-degree AV block, hyperlipidemia seen today for  for Electrophysiology evaluation of atrial fibrillation at the request of Oneil Parchment.    Today, denies symptoms of palpitations, chest pain, dyspnea, orthopnea, PND, lower extremity edema, claudication, dizziness, presyncope, syncope, bleeding, or neurologic sequela. The patient is tolerating medications without difficulties. Plan AF ablation today.   EP Information / Studies Reviewed:    EKG is ordered today. Personal review as below.        Risk Assessment/Calculations:    CHA2DS2-VASc Score = 1   This indicates a 0.6% annual risk of stroke. The patient's score is based upon: CHF History: 0 HTN History: 0 Diabetes History: 0 Stroke History: 0 Vascular Disease History: 0 Age Score: 1 Gender Score: 0             Physical Exam:   VS:  BP 124/87   Pulse 68   Temp 99 F (37.2 C) (Oral)   Resp 16   Ht 5' 11 (1.803 m)   Wt 85.3 kg   SpO2 96%   BMI 26.22 kg/m    Wt Readings from Last 3 Encounters:  11/11/24 85.3 kg  09/05/24 88.9 kg  08/07/21 89.8 kg    GEN: Well nourished, well developed in no acute distress NECK: No JVD; No carotid bruits CARDIAC: Regular rate and rhythm, no murmurs, rubs, gallops RESPIRATORY:  Clear to auscultation without rales, wheezing or rhonchi  ABDOMEN: Soft, non-tender, non-distended EXTREMITIES:  No edema; No deformity    ASSESSMENT AND PLAN:    1.  Paroxysmal atrial fibrillation: Tony Silva has presented today for surgery, with the diagnosis of AF.  The various methods of treatment have been discussed with the  patient and family. After consideration of risks, benefits and other options for treatment, the patient has consented to  Procedure(s): Catheter ablation as a surgical intervention .  Risks include but not limited to complete heart block, stroke, esophageal damage, nerve damage, bleeding, vascular damage, tamponade, perforation, MI, and death. The patient's history has been reviewed, patient examined, no change in status, stable for surgery.  I have reviewed the patient's chart and labs.  Questions were answered to the patient's satisfaction.    [name], MD 11/11/2024 8:49 AM  "

## 2024-11-11 NOTE — Anesthesia Procedure Notes (Signed)
 Procedure Name: Intubation Date/Time: 11/11/2024 9:45 AM  Performed by: Chaney Ozell CROME, CRNAPre-anesthesia Checklist: Patient identified, Emergency Drugs available, Suction available and Patient being monitored Patient Re-evaluated:Patient Re-evaluated prior to induction Oxygen Delivery Method: Circle System Utilized Preoxygenation: Pre-oxygenation with 100% oxygen Induction Type: IV induction Ventilation: Mask ventilation without difficulty Laryngoscope Size: Mac and 4 Grade View: Grade II Tube type: Oral Tube size: 8.0 mm Number of attempts: 1 Airway Equipment and Method: Stylet and Oral airway Placement Confirmation: ETT inserted through vocal cords under direct vision, positive ETCO2 and breath sounds checked- equal and bilateral Secured at: 23 cm Tube secured with: Tape Dental Injury: Teeth and Oropharynx as per pre-operative assessment

## 2024-11-11 NOTE — Progress Notes (Signed)
 Discharge instructions reviewed with patient and spouse at bedside. Denies questions concerns. PT tolerated PO intake. Ambulated in the hallway, was able to void without difficulty. Seen by MD. Incision site remains clean dry and intact. No s/s of complications. PT escorted from the unit via wheel chair to personal vehicle.

## 2024-11-11 NOTE — Transfer of Care (Signed)
 Immediate Anesthesia Transfer of Care Note  Patient: Tony Silva  Procedure(s) Performed: ATRIAL FIBRILLATION ABLATION  Patient Location: PACU  Anesthesia Type:General  Level of Consciousness: awake, alert , oriented, and patient cooperative  Airway & Oxygen Therapy: Patient Spontanous Breathing and Patient connected to nasal cannula oxygen  Post-op Assessment: Report given to RN, Post -op Vital signs reviewed and stable, and Patient moving all extremities  Post vital signs: Reviewed and stable  Last Vitals:  Vitals Value Taken Time  BP    Temp    Pulse 82 11/11/24 10:54  Resp 13 11/11/24 10:54  SpO2 99 % 11/11/24 10:54  Vitals shown include unfiled device data.  Last Pain:  Vitals:   11/11/24 0805  TempSrc: Oral         Complications: There were no known notable events for this encounter.

## 2024-11-11 NOTE — Discharge Instructions (Signed)
 Femoral Site Care The following information offers guidance on how to care for yourself after your procedure. Your health care provider may also give you more specific instructions. If you have problems or questions, contact your health care provider. What can I expect after the procedure? After the procedure, it is common to have bruising and tenderness at the incision site. This usually fades within 1-2 weeks. Follow these instructions at home: Incision site care  Follow instructions from your health care provider about how to take care of your incision site. Make sure you: Wash your hands with soap and water for at least 20 seconds before and after you change your bandage (dressing). If soap and water are not available, use hand sanitizer. Remove your dressing in 24 hours. Leave stitches (sutures), skin glue, or adhesive strips in place. These skin closures may need to stay in place for 2 weeks or longer. If adhesive strip edges start to loosen and curl up, you may trim the loose edges. Do not remove adhesive strips completely unless your health care provider tells you to do that. Do not take baths, swim, or use a hot tub for at least 1 week. You may shower 24 hours after the procedure or as told by your health care provider. Gently wash the incision site with plain soap and water. Pat the area dry with a clean towel. Do not rub the site. This may cause bleeding. Do not apply powder or lotion to the site. Keep the site clean and dry. Check your femoral site every day for signs of infection. Check for: Redness, swelling, or pain. Fluid or blood. Warmth. Pus or a bad smell. Activity If you were given a sedative during the procedure, it can affect you for several hours. Do not drive or operate machinery until your health care provider says that it is safe. Rest as told by your health care provider. Avoid sitting for a long time without moving. Get up to take short walks every 1-2 hours. This  is important to improve blood flow and breathing. Ask for help if you feel weak or unsteady. Return to your normal activities as told by your health care provider. Ask your health care provider what activities are safe for you and when you can return to work. Avoid activities that take a lot of effort for the first 2-3 days after your procedure, or as long as directed. Do not lift anything that is heavier than 10 lb (4.5 kg), or the limit that you are told, until your health care provider says that it is safe. General instructions Take over-the-counter and prescription medicines only as told by your health care provider. If you will be going home right after the procedure, plan to have a responsible adult care for you for the time you are told. This is important. Keep all follow-up visits. This is important. Contact a health care provider if: You have a fever or chills. You have any of these signs of infection at your incision site: Redness, swelling, or pain. Fluid or blood. Warmth. Pus or a bad smell. Get help right away if: The incision area swells very fast. The incision area is bleeding, and the bleeding does not stop when you hold steady pressure on the area. Your leg or foot becomes pale, cool, tingly, or numb. These symptoms may represent a serious problem that is an emergency. Do not wait to see if the symptoms will go away. Get medical help right away. Call your local emergency  services (911 in the [place] Do not drive yourself to the hospital. Summary After the procedure, it is common to have bruising and tenderness that fade within 1-2 weeks. Check your femoral site every day for signs of infection. Do not lift anything that is heavier than 10 lb (4.5 kg), or the limit that you are told, until your health care provider says that it is safe. Get help right away if the incision area swells very fast, you have bleeding at the incision area that does not stop, or your leg or foot  becomes pale, cool, or numb. This information is not intended to replace advice given to you by your health care provider. Make sure you discuss any questions you have with your health care provider. Document Revised: 07/03/2021 Document Reviewed: 12/02/2020 Elsevier Patient Education  2024 [employer].

## 2024-11-11 NOTE — Anesthesia Postprocedure Evaluation (Signed)
"   Anesthesia Post Note  Patient: Tony Silva  Procedure(s) Performed: ATRIAL FIBRILLATION ABLATION     Patient location during evaluation: PACU Anesthesia Type: General Level of consciousness: awake and alert Pain management: pain level controlled Vital Signs Assessment: post-procedure vital signs reviewed and stable Respiratory status: spontaneous breathing, nonlabored ventilation and respiratory function stable Cardiovascular status: blood pressure returned to baseline and stable Postop Assessment: no apparent nausea or vomiting Anesthetic complications: no   There were no known notable events for this encounter.  Last Vitals:  Vitals:   11/11/24 1200 11/11/24 1215  BP: 119/83 118/81  Pulse: 77 77  Resp: 11 14  Temp:    SpO2: 96% 93%    Last Pain:  Vitals:   11/11/24 1215  TempSrc:   PainSc: 0-No pain                 Tony Silva      "

## 2024-11-12 ENCOUNTER — Encounter (HOSPITAL_COMMUNITY): Payer: Self-pay | Admitting: Cardiology

## 2024-11-12 LAB — POCT ACTIVATED CLOTTING TIME: Activated Clotting Time: 286 s

## 2024-11-14 ENCOUNTER — Telehealth (HOSPITAL_COMMUNITY): Payer: Self-pay

## 2024-11-14 NOTE — Telephone Encounter (Addendum)
 Spoke with patient to complete post procedure follow up call.  Patient reports no complications with groin sites.   Instructions reviewed with patient:  It is normal to have bruising, tenderness, mild swelling, and a pea or marble sized lump/knot at the groin site which can take up to three months to resolve.  Get help right away if you notice sudden swelling at the puncture site.  Check your puncture site every day for signs of infection: fever, redness, swelling, pus drainage, warmth, foul odor or excessive pain. If this occurs, please call [phone], to speak with the RN Navigator. Get help right away if your puncture site is bleeding and the bleeding does not stop after applying firm pressure to the area.  You may continue to have skipped beats/ atrial fibrillation during the first several months after your procedure.  It is very important not to miss any doses of your blood thinner Eliquis.    You will follow up with the Afib clinic 4 weeks after your procedure and follow up with the APP 3 months after your procedure.  Activity restrictions reviewed.  Patient verbalized understanding to all instructions provided.

## 2024-11-15 MED FILL — Fentanyl Citrate Preservative Free (PF) Inj 100 MCG/2ML: INTRAMUSCULAR | Qty: 2 | Status: AC

## 2024-12-09 ENCOUNTER — Ambulatory Visit (HOSPITAL_COMMUNITY): Admitting: Physician Assistant

## 2025-02-15 ENCOUNTER — Ambulatory Visit: Admitting: Cardiology
